# Patient Record
Sex: Male | Born: 1985 | Race: Black or African American | Hispanic: No | Marital: Single | Smoking: Current every day smoker
Health system: Southern US, Community
[De-identification: ages and names within clinical notes are randomized; demographics above are authoritative.]

## PROBLEM LIST (undated history)

## (undated) DIAGNOSIS — Z789 Other specified health status: Secondary | ICD-10-CM

## (undated) HISTORY — PX: OTHER SURGICAL HISTORY: SHX169

---
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
## 2004-07-15 ENCOUNTER — Emergency Department (HOSPITAL_COMMUNITY): Admission: EM | Admit: 2004-07-15 | Discharge: 2004-07-15 | Payer: Self-pay | Admitting: Emergency Medicine

## 2005-01-17 ENCOUNTER — Emergency Department (HOSPITAL_COMMUNITY): Admission: EM | Admit: 2005-01-17 | Discharge: 2005-01-17 | Payer: Self-pay | Admitting: Emergency Medicine

## 2005-06-05 ENCOUNTER — Emergency Department (HOSPITAL_COMMUNITY): Admission: EM | Admit: 2005-06-05 | Discharge: 2005-06-05 | Payer: Self-pay | Admitting: Emergency Medicine

## 2005-07-14 ENCOUNTER — Emergency Department (HOSPITAL_COMMUNITY): Admission: EM | Admit: 2005-07-14 | Discharge: 2005-07-15 | Payer: Self-pay | Admitting: Emergency Medicine

## 2006-03-24 ENCOUNTER — Emergency Department (HOSPITAL_COMMUNITY): Admission: EM | Admit: 2006-03-24 | Discharge: 2006-03-24 | Payer: Self-pay | Admitting: Family Medicine

## 2006-10-22 ENCOUNTER — Emergency Department (HOSPITAL_COMMUNITY): Admission: EM | Admit: 2006-10-22 | Discharge: 2006-10-23 | Payer: Self-pay | Admitting: Emergency Medicine

## 2010-07-25 NOTE — Discharge Summary (Signed)
[facility]  Patient:    Joseph Bernard, Joseph Bernard                   MRN: [id number] Adm. Date:  [id number] Disc. Date: [id number] Attending:  Cathren Laine                           Discharge Summary  REASON FOR ADMISSION:  This 25 year old black male was admitted status post drug overdose with over the counter pills.  HISTORY OF PRESENT ILLNESS:  The patient reports on the day prior to admission that he had gotten into an argument with his mother and took an overdose of pills.  He apparently did this in an attempt to get out of going to wilderness camp.  He is court ordered into wilderness camp after having been placed in a juvenile detention center eight times in the past and having multiple different adjudications for various offenses.  He was taken off antidepressant medication several weeks ago in preparation for his going to _________  camp, has tolerated this well.  He denies any current symptoms of major depression. He does admit to a depressed, irritable, and angry mood whenever he thinks about going to wilderness camp, but denies that this mood persists for most of the day every day.  He does admit to some early morning awakening.  He denies any change in appetite or weight.  He denies any anhedonia or giving up on activities previously pleasureable.  He denies any fatigue or loss of energy. He denies any psychomotor agitation or retardation.  PAST HISTORY:  Significant for his being an inpatient at [facility] in March of 2002 following a similar drug overdose attempt that was felt to be a suicidal gesture.  At that time he was attempting to avoid going to juvenile detention and took a drug overdose instead.  He does report that his current episode is similar to that and that he was trying to get out of going to wilderness camp, but now he recognizes that his options are either going to wilderness camp or going to a training school and he would  prefer to go to wilderness camp.  He has [facility] Betti Cruz.  He has a long-standing history of attention deficit hyperactivity disorder, conduct disorder, and polysubstance abuse versus dependence.  SUBSTANCE ABUSE HISTORY:  He has been using cannabis since the age of 21, approximately twice per month or more often if available.  His last use was approximately two weeks ago.  He has a long-standing history of alcohol abuse and had heavy use of alcohol up until March when he entered the [facility].  He smokes a half a pack of cigarettes per day and has done so for the past 1-1/2 years.  LEGAL HISTORY:  Legally he has been adjudicated for credit card theft, fighting, stealing computers and computer disks, as well as being adjudicated for probation violations.  He has been in juvenile detention at least eight times.  PAST MEDICAL HISTORY:  He has no past medical history of any problems.  ALLERGIES:  He has no known drug allergies or sensitivities.  MEDICATIONS:  He is on no current medications.  FAMILY/SOCIAL HISTORY:  He lives with his parents and one brother.  His grandmother has a history of polysubstance dependence.  The patient is currently in the 10th grade.  STRENGTHS AND ASSETS:  His parents are very supportive of him.  MENTAL STATUS EXAMINATION:  The patient presents as a well-developed, well-nourished adolescent black male, who is alert, oriented x 4, disheveled and unkept, and his appearance is compatible with his stated age.  His speech is coherent with a normal rate and volume of speech.  He displays no looseness of associations or evidence of a thought disorder.  His affect and mood are irritable, angry, and somewhat depressed, but they are quite labile and shift rapidly.  He appears to be depressed and irritable only when discussing his going to wilderness camp, but is quite happy when talking  about other things. He does not display a furrowed brow.  He does not display any psychomotor agitation or retardation.  His concentration and attention span are decreased. He displays poor impulse control and is easily distracted.  His immediate recall, short term memory, and remote memory are intact.  His thought processes are goal directed.  His similarities and differences are within normal limits and he is able to abstract to simple proverbs.  His strengths and assets are that his parents are very supportive of him.  ADMITTING AND DISCHARGE DIAGNOSES: Axis I:    1. 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            2. Rule out major depression, mild to moderate.            3. Attention deficit hyperactivity disorder, combined type.            4. Conduct disorder.            5. Polysubstance abuse.            6. Rule out polysubstance dependence. Axis II:   1. Antisocial traits.            2. Rule out personality disorder, not otherwise specified.            3. Rule out learning disorder, not otherwise specified. Axis III:  None. Axis IV:   Severe. Axis V:    Code 30.  FURTHER EVALUATION AND TREATMENT RECOMMENDATIONS: 1. The patient has been admitted for observation.  He does not appear to be    in need of an antidepressant medication at this time.  Psychotherapy this    morning has focused on improving his impulse control, decreasing potential    for self-harm and decreasing cognitive distortions as well as improving    his anger management skills.  He no longer appears to be a danger to    himself or others and agrees that wilderness camp is probably his best    option at this point in time.  He appears to be motivated to go there at    this point and consequently, I feel that he may be psychiatrically cleared    to enter wilderness camp.  He is consequently discharged to the custody of    his mother and father and probation officer,  who will transport him to    that facility. 2. He is discharged on an unrestricted level of activity and a regular diet.  MEDICATIONS:  He is discharged on no medications.   FOLLOW-UP:  He will follow up for all further mental health care at wilderness camp.  Consequently, I will sign off on the case at this time. DD:  08/18/00 TD:  08/18/00 Job: [id number]

## 2010-08-31 ENCOUNTER — Emergency Department (HOSPITAL_BASED_OUTPATIENT_CLINIC_OR_DEPARTMENT_OTHER)
Admission: EM | Admit: 2010-08-31 | Discharge: 2010-08-31 | Disposition: A | Discharge status: Other Acute Inpatient Hospital | Payer: Self-pay | Attending: Emergency Medicine | Admitting: Emergency Medicine

## 2010-08-31 ENCOUNTER — Emergency Department (INDEPENDENT_AMBULATORY_CARE_PROVIDER_SITE_OTHER): Payer: Self-pay

## 2010-08-31 ENCOUNTER — Inpatient Hospital Stay (HOSPITAL_COMMUNITY)
Admission: AD | Admit: 2010-08-31 | Discharge: 2010-09-01 | DRG: 918 | Disposition: A | Discharge status: Home / Self Care | Payer: Self-pay | Source: Ambulatory Visit | Attending: Internal Medicine | Admitting: Internal Medicine

## 2010-08-31 DIAGNOSIS — F909 Attention-deficit hyperactivity disorder, unspecified type: Secondary | ICD-10-CM | POA: Diagnosis present

## 2010-08-31 DIAGNOSIS — F988 Other specified behavioral and emotional disorders with onset usually occurring in childhood and adolescence: Secondary | ICD-10-CM | POA: Insufficient documentation

## 2010-08-31 DIAGNOSIS — T6591XA Toxic effect of unspecified substance, accidental (unintentional), initial encounter: Secondary | ICD-10-CM

## 2010-08-31 DIAGNOSIS — IMO0002 Reserved for concepts with insufficient information to code with codable children: Secondary | ICD-10-CM | POA: Diagnosis present

## 2010-08-31 DIAGNOSIS — T65891A Toxic effect of other specified substances, accidental (unintentional), initial encounter: Secondary | ICD-10-CM

## 2010-08-31 DIAGNOSIS — R4182 Altered mental status, unspecified: Secondary | ICD-10-CM | POA: Insufficient documentation

## 2010-08-31 DIAGNOSIS — F17200 Nicotine dependence, unspecified, uncomplicated: Secondary | ICD-10-CM | POA: Insufficient documentation

## 2010-08-31 DIAGNOSIS — D72829 Elevated white blood cell count, unspecified: Secondary | ICD-10-CM

## 2010-08-31 DIAGNOSIS — F29 Unspecified psychosis not due to a substance or known physiological condition: Secondary | ICD-10-CM | POA: Diagnosis present

## 2010-08-31 DIAGNOSIS — T551X1A Toxic effect of detergents, accidental (unintentional), initial encounter: Principal | ICD-10-CM | POA: Diagnosis present

## 2010-08-31 LAB — COMPREHENSIVE METABOLIC PANEL
ALT: 19 U/L (ref 0–53)
Alkaline Phosphatase: 52 U/L (ref 39–117)
CO2: 19 mEq/L (ref 19–32)
GFR calc Af Amer: >60 mL/min (ref >60)
GFR calc non Af Amer: >60 mL/min (ref >60)
Glucose, Bld: 134 mg/dL — ABNORMAL HIGH (ref 70–99)
Potassium: 3.8 mEq/L (ref 3.5–5.1)
Sodium: 140 mEq/L (ref 135–145)
Total Protein: 6.3 g/dL (ref 6.0–8.3)

## 2010-08-31 LAB — DIFFERENTIAL
Basophils Absolute: 0 10*3/uL (ref 0.0–0.1)
Lymphocytes Relative: 9 % — ABNORMAL LOW (ref 12–46)
Neutro Abs: 13.3 10*3/uL — ABNORMAL HIGH (ref 1.7–7.7)

## 2010-08-31 LAB — RAPID URINE DRUG SCREEN, HOSP PERFORMED: Benzodiazepines: POSITIVE — AB

## 2010-08-31 LAB — URINALYSIS, ROUTINE W REFLEX MICROSCOPIC
Glucose, UA: NEGATIVE mg/dL
Hgb urine dipstick: NEGATIVE
Protein, ur: 30 mg/dL — AB
pH: 6 (ref 5.0–8.0)

## 2010-08-31 LAB — URINE MICROSCOPIC-ADD ON

## 2010-08-31 LAB — MRSA PCR SCREENING: MRSA by PCR: NEGATIVE

## 2010-08-31 LAB — CK: Total CK: 651 U/L — ABNORMAL HIGH (ref 7–232)

## 2010-08-31 LAB — CBC
HCT: 44.1 % (ref 39.0–52.0)
Hemoglobin: 15.7 g/dL (ref 13.0–17.0)
RBC: 4.88 MIL/uL (ref 4.22–5.81)
WBC: 16.2 10*3/uL — ABNORMAL HIGH (ref 4.0–10.5)

## 2010-08-31 LAB — PHOSPHORUS: Phosphorus: 4.5 mg/dL (ref 2.3–4.6)

## 2010-08-31 LAB — LACTIC ACID, PLASMA: Lactic Acid, Venous: 0.4 mmol/L — ABNORMAL LOW (ref 0.5–2.2)

## 2010-08-31 LAB — MAGNESIUM: Magnesium: 2.7 mg/dL — ABNORMAL HIGH (ref 1.5–2.5)

## 2010-09-01 DIAGNOSIS — F4310 Post-traumatic stress disorder, unspecified: Secondary | ICD-10-CM

## 2010-09-01 LAB — CBC
MCH: 31.4 pg (ref 26.0–34.0)
MCHC: 33.1 g/dL (ref 30.0–36.0)
MCV: 95 fL (ref 78.0–100.0)
Platelets: 179 10*3/uL (ref 150–400)
RBC: 4.01 MIL/uL — ABNORMAL LOW (ref 4.22–5.81)
RDW: 13.3 % (ref 11.5–15.5)

## 2010-09-01 LAB — BASIC METABOLIC PANEL
Calcium: 8.5 mg/dL (ref 8.4–10.5)
Creatinine, Ser: 0.78 mg/dL (ref 0.50–1.35)
GFR calc Af Amer: >60 mL/min (ref >60)
GFR calc non Af Amer: >60 mL/min (ref >60)
Sodium: 140 mEq/L (ref 135–145)

## 2010-09-01 LAB — CK: Total CK: 547 U/L — ABNORMAL HIGH (ref 7–232)

## 2010-09-02 NOTE — Consult Note (Signed)
  NAME:  Joseph Bernard, KINNARD NO.:  [id number]  MEDICAL RECORD NO.:  [id number]  LOCATION:  1513                         FACILITY:  [facility]  PHYSICIAN:  Eulogio Ditch, MD DATE OF BIRTH:  09-26-1985  DATE OF CONSULTATION:  09/01/2010 DATE OF DISCHARGE:                                CONSULTATION   REASON FOR CONSULTATION:  Polysubstance abuse, overdose on bath salts.  HISTORY OF PRESENT ILLNESS:  [age]-year-old African American male, who has extensive history of abusing drugs.  He also has a history of ADHD, bipolar disorder and prior suicide attempts.  Patient reported that he do not have history of bipolar disorder, but he used drugs.  He is not taking cocaine or alcohol now, but he started using bath salt after remaining sober for 9 months.  Patient also has a history of 8 juvenile accounts earlier for assault, robberies, and probation violation.  In the ER, patient was very agitated and was medicated with Geodon.  Patient has a history of admission to behavioral health in 2002.  Patient reported that he works at and also in the [employer], lives with the mother.  MENTAL STATUS EXAMINATION:  Patient is very calm, cooperative during the interview.  Fair eye contact.  No abnormal movements noticed.  Speech: Normal in rate, rhythm and volume.  Mood:  Euthymic.  Affect:  Mood congruent.  Thought process:  Logical and goal directed.  Thought content:  Not suicidal or homicidal, not delusional.  Thought perception:  No audiovisual hallucination reported, not internally preoccupied.  Cognition:  Alert, awake, oriented x3.  Attention and concentration:  Fair.  Memory:  Intact.  Abstraction ability:  Good. Insight and judgment:  Intact.  DIAGNOSES:  AXIS I: 1. Polysubstance dependence. 2. Mood disorder, not otherwise specified. AXIS II:  Cluster B personality traits, rule out antisocial personality disorder/borderline personality disorder. AXIS III:  See medical  notes. AXIS IV:  Chronic substance abuse. AXIS V:  50.  RECOMMENDATIONS: 1. At this time, patient is not psychotic, manic or suicidal that he     need admission in the inpatient psychiatry. 2. Patient reported that he will continuously use bath salts and at     certain points when he thinks he need counseling or to be on any     medication, he will go to the mental health by himself, but at this     time he is not interested in getting any kind of rehab or any     treatment. 3. Psychoeducation given to the patient regarding abuse of the drugs. 4. At this time, patient does not meet criteria to be admitted in the     inpatient on IVC. 5. I told the social worker to call the mother and educate on her as     she was very concerned and wants him to be admitted in the     hospital, but patient does not meet criteria to be admitted on IVC.     Eulogio Ditch, MD     SA/MEDQ  D:  09/01/2010  T:  09/01/2010  Job:  [id number]  Electronically Signed by Eulogio Ditch  on 09/02/2010 12:09:01 PM

## 2010-09-05 ENCOUNTER — Emergency Department (HOSPITAL_COMMUNITY)
Admission: EM | Admit: 2010-09-05 | Discharge: 2010-09-06 | Disposition: A | Discharge status: Home / Self Care | Payer: Self-pay | Attending: Emergency Medicine | Admitting: Emergency Medicine

## 2010-09-05 DIAGNOSIS — F1410 Cocaine abuse, uncomplicated: Secondary | ICD-10-CM | POA: Insufficient documentation

## 2010-09-05 DIAGNOSIS — F1910 Other psychoactive substance abuse, uncomplicated: Secondary | ICD-10-CM | POA: Insufficient documentation

## 2010-09-05 DIAGNOSIS — F17200 Nicotine dependence, unspecified, uncomplicated: Secondary | ICD-10-CM | POA: Insufficient documentation

## 2010-09-05 DIAGNOSIS — F1010 Alcohol abuse, uncomplicated: Secondary | ICD-10-CM | POA: Insufficient documentation

## 2010-09-05 DIAGNOSIS — R4182 Altered mental status, unspecified: Secondary | ICD-10-CM | POA: Insufficient documentation

## 2010-09-05 DIAGNOSIS — F411 Generalized anxiety disorder: Secondary | ICD-10-CM | POA: Insufficient documentation

## 2010-09-05 DIAGNOSIS — F1210 Cannabis abuse, uncomplicated: Secondary | ICD-10-CM | POA: Insufficient documentation

## 2010-09-05 DIAGNOSIS — F988 Other specified behavioral and emotional disorders with onset usually occurring in childhood and adolescence: Secondary | ICD-10-CM | POA: Insufficient documentation

## 2010-09-05 LAB — URINALYSIS, ROUTINE W REFLEX MICROSCOPIC
Bilirubin Urine: NEGATIVE
Ketones, ur: NEGATIVE mg/dL
Leukocytes, UA: NEGATIVE
Nitrite: NEGATIVE
Protein, ur: NEGATIVE mg/dL

## 2010-09-05 LAB — DIFFERENTIAL
Basophils Relative: 1 % (ref 0–1)
Eosinophils Absolute: 0.3 10*3/uL (ref 0.0–0.7)
Lymphs Abs: 1.7 10*3/uL (ref 0.7–4.0)
Monocytes Relative: 14 % — ABNORMAL HIGH (ref 3–12)
Neutro Abs: 4.9 10*3/uL (ref 1.7–7.7)
Neutrophils Relative %: 60 % (ref 43–77)

## 2010-09-05 LAB — CBC
Hemoglobin: 14.8 g/dL (ref 13.0–17.0)
MCH: 32 pg (ref 26.0–34.0)
Platelets: 226 10*3/uL (ref 150–400)
RBC: 4.62 MIL/uL (ref 4.22–5.81)
WBC: 8.1 10*3/uL (ref 4.0–10.5)

## 2010-09-05 LAB — ETHANOL: Alcohol, Ethyl (B): 93 mg/dL — ABNORMAL HIGH (ref 0–11)

## 2010-09-05 LAB — BASIC METABOLIC PANEL
BUN: 6 mg/dL (ref 6–23)
CO2: 22 mEq/L (ref 19–32)
Chloride: 102 mEq/L (ref 96–112)
GFR calc non Af Amer: >60 mL/min (ref >60)
Glucose, Bld: 105 mg/dL — ABNORMAL HIGH (ref 70–99)
Potassium: 3.3 mEq/L — ABNORMAL LOW (ref 3.5–5.1)
Sodium: 137 mEq/L (ref 135–145)

## 2010-09-05 LAB — RAPID URINE DRUG SCREEN, HOSP PERFORMED
Barbiturates: NOT DETECTED
Benzodiazepines: POSITIVE — AB

## 2010-09-06 ENCOUNTER — Emergency Department (HOSPITAL_COMMUNITY)
Admission: EM | Admit: 2010-09-06 | Discharge: 2010-09-07 | Disposition: A | Discharge status: Other Acute Inpatient Hospital | Payer: Self-pay | Attending: Emergency Medicine | Admitting: Emergency Medicine

## 2010-09-06 DIAGNOSIS — X58XXXA Exposure to other specified factors, initial encounter: Secondary | ICD-10-CM | POA: Insufficient documentation

## 2010-09-06 DIAGNOSIS — S51809A Unspecified open wound of unspecified forearm, initial encounter: Secondary | ICD-10-CM | POA: Insufficient documentation

## 2010-09-06 DIAGNOSIS — R45851 Suicidal ideations: Secondary | ICD-10-CM | POA: Insufficient documentation

## 2010-09-06 DIAGNOSIS — F988 Other specified behavioral and emotional disorders with onset usually occurring in childhood and adolescence: Secondary | ICD-10-CM | POA: Insufficient documentation

## 2010-09-06 DIAGNOSIS — F341 Dysthymic disorder: Secondary | ICD-10-CM | POA: Insufficient documentation

## 2010-09-06 LAB — CBC
Hemoglobin: 14.6 g/dL (ref 13.0–17.0)
MCV: 93 fL (ref 78.0–100.0)
Platelets: 219 10*3/uL (ref 150–400)
RBC: 4.43 MIL/uL (ref 4.22–5.81)
WBC: 8.8 10*3/uL (ref 4.0–10.5)

## 2010-09-06 LAB — RAPID URINE DRUG SCREEN, HOSP PERFORMED
Amphetamines: NOT DETECTED
Benzodiazepines: NOT DETECTED
Tetrahydrocannabinol: NOT DETECTED

## 2010-09-06 LAB — COMPREHENSIVE METABOLIC PANEL
AST: 28 U/L (ref 0–37)
CO2: 23 mEq/L (ref 19–32)
Calcium: 9.3 mg/dL (ref 8.4–10.5)
Creatinine, Ser: 0.82 mg/dL (ref 0.50–1.35)
GFR calc Af Amer: >60 mL/min (ref >60)
GFR calc non Af Amer: >60 mL/min (ref >60)
Glucose, Bld: 81 mg/dL (ref 70–99)

## 2010-09-06 LAB — ACETAMINOPHEN LEVEL: Acetaminophen (Tylenol), Serum: <15 ug/mL (ref 10–30)

## 2010-09-06 LAB — ETHANOL: Alcohol, Ethyl (B): 234 mg/dL — ABNORMAL HIGH (ref 0–11)

## 2010-09-06 LAB — DIFFERENTIAL
Basophils Absolute: 0.1 10*3/uL (ref 0.0–0.1)
Basophils Relative: 1 % (ref 0–1)
Eosinophils Relative: 3 % (ref 0–5)
Lymphocytes Relative: 25 % (ref 12–46)
Monocytes Relative: 13 % — ABNORMAL HIGH (ref 3–12)
Neutro Abs: 5.1 10*3/uL (ref 1.7–7.7)

## 2010-09-12 NOTE — Discharge Summary (Signed)
  NAME:  Joseph Bernard, HUARACHA NO.:  [id number]  MEDICAL RECORD NO.:  [id number]  LOCATION:  1513                         FACILITY:  [facility]  PHYSICIAN:  Peggye Pitt, M.D. DATE OF BIRTH:  28-Jul-1985  DATE OF ADMISSION:  08/31/2010 DATE OF DISCHARGE:  09/01/2010                              DISCHARGE SUMMARY   PRIMARY CARE PHYSICIAN:  He is currently unassigned, but we will set him up with [facility] as his new PCP.  DISCHARGE DIAGNOSES: 1. Acute psychosis/agitation secondary to bath salt use. 2. History of attention deficit hyperactivity disorder. 3. Prior history of suicidal attempts.  DISCHARGE MEDICATIONS:  Include: 1. Xanax 1 mg three times a day. 2. Adderall 30 mg daily.  CONSULTATION ON THIS HOSPITALIZATION:  Eulogio Ditch, M.D. with psychiatry.  IMAGES AND PROCEDURES:  Chest x-ray on August 31, 2010 that showed no acute abnormalities.  HISTORY AND PHYSICAL:  For full details, please refer to dictation on August 31, 2010, but in brief, Josey is a 25 year old African American young gentleman, who presented to the hospital with psychosis and aggressiveness and reported ingestion of bath salts.  By the time he was seen by myself, he was already in the hospital and sedated and hence no history was able to be obtained.  HOSPITAL COURSE BY PROBLEM: 1. Acute psychosis and agitation:  This is secondary to the bath salt     use.  He today is awake and has been evaluated by Psychiatry, who     believes that he is competent to make his own decisions and hence     does not require involuntary commitment.  Patient has decided to     discharge home today.  Social work has seen him; however, he is not     interested in any help that she may be able to offer. 2. Attention deficit hyperactivity disorder:  I have given him a 1-     month prescription of Adderall.  Subsequent prescriptions will need     to be filled by PCP.  VITAL SIGNS ON DAY OF DISCHARGE:   Blood pressure 132/71, heart rate 78, respirations 20, sats 97% on room air and temperature 97.4.     Peggye Pitt, M.D.     EH/MEDQ  D:  09/01/2010  T:  09/01/2010  Job:  [id number]  cc:   Eulogio Ditch, MD  Electronically Signed by Peggye Pitt M.D. on 09/12/2010 08:14:47 AM

## 2010-09-12 NOTE — H&P (Signed)
NAME:  Joseph Bernard, Joseph Bernard NO.:  [id number]  MEDICAL RECORD NO.:  [id number]  LOCATION:  1228                         FACILITY:  [facility]  PHYSICIAN:  Peggye Pitt, M.D. DATE OF BIRTH:  May 02, 1985  DATE OF ADMISSION:  08/31/2010 DATE OF DISCHARGE:                             HISTORY & PHYSICAL   PRIMARY CARE PHYSICIAN:  The patient's primary care physician is unknown at this time.  CHIEF COMPLAINT:  Aggressiveness, agitation and "overdose on bath salts."  HISTORY AND PHYSICAL:  Joseph Bernard is a 25 year old African-American young gentleman who reportedly has a history of prior suicide attempts, attention deficit hyperactivity disorder and anxiety, who was brought to [facility] Emergency Department via EMS.  It is unclear to me at this time how EMS picked him up.  Per ED physician report, it appears Joseph Bernard on arrival was extremely agitated, hallucinating with very high heart rates into the 180s and required very strong sedation with 20 mg of Geodon and 20 mg of Versed as well as 6 mg of Ativan.  At this moment when I am seeing Joseph Bernard, he is completely sedated and is unarousable.  For this reason, no further history is obtainable at this time.  ALLERGIES:  Unknown.  PAST MEDICAL HISTORY:  Unknown.  MEDICATIONS:  Unknown.  SOCIAL HISTORY:  Unknown.  REVIEW OF SYSTEMS:  Unable to perform.  FAMILY HISTORY:  Unknown.  PHYSICAL EXAMINATION:  VITAL SIGNS:  Vitals on admission, blood pressure 110/67, heart rate 139, respirations 26, sats 99% on room air and temperature of 97.7. GENERAL:  In general, he is currently sedated. NECK:  His neck is supple.  No JVD, no lymphadenopathy, no bruits, no goiter. HEART:  His heart is currently regular rate and rhythm without murmurs, rubs or gallops. LUNGS:  His lungs appear clear to auscultation bilaterally anteriorly. ABDOMEN:  Soft, nontender, nondistended.  Positive bowel sounds. EXTREMITIES:  No  edema.  Positive pulses. NEUROLOGIC EXAM:  Unable to perform.  LABORATORY DATA:  Labs on admission; sodium 140, potassium 3.8, chloride 101, bicarb 19, BUN 17, creatinine 1.20, glucose of 134.  All of his LFTs are within normal limits.  WBC is 16.2, hemoglobin 15.7, platelets of 216.  Tylenol level of less than 15 and alcohol level of less than 11, aspirin level less than 2, lactic acid level that is normal at 0.4. A urine drug screen that is only positive for benzodiazepines that he had already received in the ED.  Urinalysis is essentially negative.  Images performed include a chest x-ray that showed no acute abnormalities.  ASSESSMENT AND PLAN:  Bad salts toxic ingestion.  Unclear at this point if this was on overdose or suicide attempt, this is unlikely.  He is currently sedated, although it is noted that he was very aggressive and required four-point leather restraints while in the emergency department.  For now, we will simply watch him.  I will order a 12-lead EKG to make sure he does not have prolonged QT just in case we need to give him any more antipsychotics or benzodiazepines.  We will start him with aggressive hydration with normal saline.  A CPK level will be ordered.  Rest of  lab values at this moment appear intact without acute abnormalities.  Poison control has been called and updated on the patient's situation.  A psychiatric consult has been obtained as well.     Peggye Pitt, M.D.     EH/MEDQ  D:  08/31/2010  T:  08/31/2010  Job:  [id number]  Electronically Signed by Peggye Pitt M.D. on 09/12/2010 08:15:04 AM

## 2011-03-12 ENCOUNTER — Encounter: Payer: Self-pay | Admitting: Adult Health

## 2011-03-12 ENCOUNTER — Emergency Department (HOSPITAL_COMMUNITY)
Admission: EM | Admit: 2011-03-12 | Discharge: 2011-03-14 | Payer: Self-pay | Attending: Emergency Medicine | Admitting: Emergency Medicine

## 2011-03-12 DIAGNOSIS — F329 Major depressive disorder, single episode, unspecified: Secondary | ICD-10-CM

## 2011-03-12 DIAGNOSIS — R45851 Suicidal ideations: Secondary | ICD-10-CM | POA: Insufficient documentation

## 2011-03-12 DIAGNOSIS — R079 Chest pain, unspecified: Secondary | ICD-10-CM | POA: Insufficient documentation

## 2011-03-12 DIAGNOSIS — R059 Cough, unspecified: Secondary | ICD-10-CM | POA: Insufficient documentation

## 2011-03-12 DIAGNOSIS — R05 Cough: Secondary | ICD-10-CM | POA: Insufficient documentation

## 2011-03-12 DIAGNOSIS — F1410 Cocaine abuse, uncomplicated: Secondary | ICD-10-CM | POA: Insufficient documentation

## 2011-03-12 DIAGNOSIS — F3289 Other specified depressive episodes: Secondary | ICD-10-CM | POA: Insufficient documentation

## 2011-03-12 DIAGNOSIS — F17200 Nicotine dependence, unspecified, uncomplicated: Secondary | ICD-10-CM | POA: Insufficient documentation

## 2011-03-12 LAB — RAPID URINE DRUG SCREEN, HOSP PERFORMED
Amphetamines: NOT DETECTED
Barbiturates: NOT DETECTED
Benzodiazepines: NOT DETECTED

## 2011-03-12 LAB — COMPREHENSIVE METABOLIC PANEL
AST: 24 U/L (ref 0–37)
Albumin: 3.9 g/dL (ref 3.5–5.2)
Alkaline Phosphatase: 84 U/L (ref 39–117)
Chloride: 103 mEq/L (ref 96–112)
Potassium: 3.9 mEq/L (ref 3.5–5.1)
Sodium: 137 mEq/L (ref 135–145)
Total Bilirubin: 0.3 mg/dL (ref 0.3–1.2)

## 2011-03-12 LAB — CBC
Platelets: 224 10*3/uL (ref 150–400)
RDW: 13.3 % (ref 11.5–15.5)
WBC: 11.3 10*3/uL — ABNORMAL HIGH (ref 4.0–10.5)

## 2011-03-12 LAB — ACETAMINOPHEN LEVEL: Acetaminophen (Tylenol), Serum: <15 ug/mL (ref 10–30)

## 2011-03-12 MED ORDER — ONDANSETRON HCL 4 MG PO TABS
4.0000 mg | ORAL_TABLET | Freq: Three times a day (TID) | ORAL | Status: DC | PRN
  Filled 2011-03-12: qty 1

## 2011-03-12 MED ORDER — ZOLPIDEM TARTRATE 5 MG PO TABS
5.0000 mg | ORAL_TABLET | Freq: Every evening | ORAL | Status: DC | PRN
  Administered 2011-03-13: 5 mg via ORAL
  Filled 2011-03-12: qty 1

## 2011-03-12 MED ORDER — NICOTINE 21 MG/24HR TD PT24
21.0000 mg | MEDICATED_PATCH | Freq: Every day | TRANSDERMAL | Status: DC
  Administered 2011-03-13 – 2011-03-14 (×2): 21 mg via TRANSDERMAL
  Filled 2011-03-12 (×2): qty 1

## 2011-03-12 MED ORDER — QUETIAPINE FUMARATE 100 MG PO TABS
200.0000 mg | ORAL_TABLET | Freq: Every day | ORAL | Status: DC
  Administered 2011-03-12 – 2011-03-13 (×2): 200 mg via ORAL
  Filled 2011-03-12: qty 2

## 2011-03-12 MED ORDER — LORAZEPAM 1 MG PO TABS
2.0000 mg | ORAL_TABLET | Freq: Once | ORAL | Status: AC
  Administered 2011-03-12: 2 mg via ORAL
  Filled 2011-03-12: qty 2

## 2011-03-12 MED ORDER — ALUM & MAG HYDROXIDE-SIMETH 200-200-20 MG/5ML PO SUSP: 30.0000 mL | ORAL | Status: DC | PRN

## 2011-03-12 MED ORDER — ACETAMINOPHEN 325 MG PO TABS: 650.0000 mg | ORAL_TABLET | ORAL | Status: DC | PRN

## 2011-03-12 MED ORDER — IBUPROFEN 600 MG PO TABS: 600.0000 mg | ORAL_TABLET | Freq: Three times a day (TID) | ORAL | Status: DC | PRN

## 2011-03-12 MED ORDER — ATOMOXETINE HCL 25 MG PO CAPS
75.0000 mg | ORAL_CAPSULE | Freq: Every day | ORAL | Status: DC
  Administered 2011-03-13: 75 mg via ORAL
  Filled 2011-03-12 (×4): qty 3

## 2011-03-12 MED ORDER — QUETIAPINE FUMARATE 100 MG PO TABS
200.0000 mg | ORAL_TABLET | Freq: Every day | ORAL | Status: DC
  Filled 2011-03-12: qty 2

## 2011-03-12 MED ORDER — LORAZEPAM 1 MG PO TABS
2.0000 mg | ORAL_TABLET | Freq: Three times a day (TID) | ORAL | Status: DC | PRN
  Administered 2011-03-13 – 2011-03-14 (×3): 2 mg via ORAL
  Filled 2011-03-12 (×3): qty 2

## 2011-03-12 NOTE — ED Provider Notes (Signed)
History     CSN: [id number]  Arrival date & time 03/12/11  1950   First MD Initiated Contact with Patient 03/12/11 2116      Chief Complaint  Patient presents with  . Medical Clearance   26 year old male states he is a cocaine addict and with a history of bipolar disorder and wishes help with detox. He also states that he has not used cocaine in over a week. However, he does express that he is extremely depressed and has had suicidal ideation. His father taking pills and cutting himself. He has had associated symptoms of early morning awakening, crying spells, and anhedonia. He denies hallucinations. He has been getting outpatient treatment for his depression at [facility], and he was sent here because they felt that they were not making adequate progress. He denies other drug use, but he does drink heavily drinking approximately a fifth of liquor a day and he does smoke cigarettes.  (Consider location/radiation/quality/duration/timing/severity/associated sxs/prior treatment) The history is provided by the patient.    History reviewed. No pertinent past medical history.  History reviewed. No pertinent past surgical history.  History reviewed. No pertinent family history.  History  Substance Use Topics  . Smoking status: Current Everyday Smoker  . Smokeless tobacco: Not on file  . Alcohol Use: Yes      Review of Systems  All other systems reviewed and are negative.    Allergies  Review of patient's allergies indicates no known allergies.  Home Medications   Current Outpatient Rx  Name Route Sig Dispense Refill  . ATOMOXETINE HCL 25 MG PO CAPS Oral Take 75 mg by mouth daily.      . QUETIAPINE FUMARATE 100 MG PO TABS Oral Take 150 mg by mouth daily.        BP 138/93  Pulse 76  Temp 98.1 F (36.7 C)  Resp 20  SpO2 100%  Physical Exam  Nursing note and vitals reviewed.  26 year old male who appears overtly depressed and is intermittently crying during the examination.  Vital signs are normal. Oxygen saturation is 100% which is normal. Head is normocephalic and atraumatic. PERRLA, EOMI. Oropharynx is clear. Neck is supple without adenopathy or JVD. Back is nontender. Lungs are clear without rales, wheezes, rhonchi. Heart has regular rate and rhythm without murmur. The chest wall tenderness. Abdomen is soft, flat, nontender without masses or hepatosplenomegaly. Extremities have full range of motion, no cyanosis or edema. Skin is warm and moist without rash. Neurologic: Mental status is significant for depression. He is oriented x3. Cranial nerves are intact, there no focal motor or sensory deficits. Psychiatric: He does appear overtly depressed. He does not make good eye contact and tends to look at the 130s intermittently crying during the exam.  ED Course  Procedures (including critical care time)   Labs Reviewed  CBC  COMPREHENSIVE METABOLIC PANEL  ETHANOL  ACETAMINOPHEN LEVEL  URINE RAPID DRUG SCREEN (HOSP PERFORMED)   No results found. Results for orders placed during the hospital encounter of 03/12/11  CBC      Component Value Range   WBC 11.3 (*) 4.0 - 10.5 (K/uL)   RBC 4.74  4.22 - 5.81 (MIL/uL)   Hemoglobin 14.8  13.0 - 17.0 (g/dL)   HCT 40.9  81.1 - 91.4 (%)   MCV 92.6  78.0 - 100.0 (fL)   MCH 31.2  26.0 - 34.0 (pg)   MCHC 33.7  30.0 - 36.0 (g/dL)   RDW 78.2  95.6 - 21.3 (%)  Platelets 224  150 - 400 (K/uL)  COMPREHENSIVE METABOLIC PANEL      Component Value Range   Sodium 137  135 - 145 (mEq/L)   Potassium 3.9  3.5 - 5.1 (mEq/L)   Chloride 103  96 - 112 (mEq/L)   CO2 27  19 - 32 (mEq/L)   Glucose, Bld 92  70 - 99 (mg/dL)   BUN 10  6 - 23 (mg/dL)   Creatinine, Ser 1.61  0.50 - 1.35 (mg/dL)   Calcium 9.3  8.4 - 09.6 (mg/dL)   Total Protein 6.9  6.0 - 8.3 (g/dL)   Albumin 3.9  3.5 - 5.2 (g/dL)   AST 24  0 - 37 (U/L)   ALT 29  0 - 53 (U/L)   Alkaline Phosphatase 84  39 - 117 (U/L)   Total Bilirubin 0.3  0.3 - 1.2 (mg/dL)   GFR calc  non Af Amer >90  >90 (mL/min)   GFR calc Af Amer >90  >90 (mL/min)  ETHANOL      Component Value Range   Alcohol, Ethyl (B) <11  0 - 11 (mg/dL)  ACETAMINOPHEN LEVEL      Component Value Range   Acetaminophen (Tylenol), Serum <15.0  10 - 30 (ug/mL)  URINE RAPID DRUG SCREEN (HOSP PERFORMED)      Component Value Range   Opiates NONE DETECTED  NONE DETECTED    Cocaine POSITIVE (*) NONE DETECTED    Benzodiazepines NONE DETECTED  NONE DETECTED    Amphetamines NONE DETECTED  NONE DETECTED    Tetrahydrocannabinol NONE DETECTED  NONE DETECTED    Barbiturates NONE DETECTED  NONE DETECTED    No results found.    No diagnosis found.    MDM  Maj. depression with associated cocaine use. He'll be admitted to the psychiatric all been unit for evaluation by ACT Team.        Dione Booze, MD 03/13/11 [phone]

## 2011-03-12 NOTE — ED Notes (Signed)
Meds ordered at 2145 given on arrival to Psych ED

## 2011-03-12 NOTE — ED Notes (Signed)
Reports abuse of crack and cocaine for 4 years, abuse of ETOH. Pt staes, "If I do not get detox right now, I am at the point where I am going to try to kill myself. I have tried before" pt will not make eye contact.

## 2011-03-13 ENCOUNTER — Encounter (HOSPITAL_COMMUNITY): Payer: Self-pay

## 2011-03-13 NOTE — ED Notes (Signed)
Pt. In shower. 

## 2011-03-13 NOTE — ED Notes (Signed)
Patient up at window asking for supplies for shower. Provided toiletries for patient and clean scrubs. Cleaned room while patient showering.

## 2011-03-13 NOTE — ED Notes (Signed)
Pt. States that he was anxious, medication given.

## 2011-03-13 NOTE — ED Notes (Signed)
TC from [place] @ [facility]. Requesting demographic info on pt for him to complete review of chart for possible IPT admission. Warden/ranger & faxed to him.

## 2011-03-13 NOTE — ED Notes (Signed)
Pt. Ambulated to bathroom without any difficulty. 

## 2011-03-13 NOTE — ED Notes (Signed)
Pt. Ate 100% of bkft. 

## 2011-03-13 NOTE — BH Assessment (Signed)
Assessment Note   Joseph Bernard is a 26 y.o. male who presents to the ED with SI with plan to overdoes. Patient reports symptoms of depression including, anhedonia, crying spells, and feelings of hopelessness. Patient reports he has a history of bipolar disorder and has attempted suicide four times, by cutting his wrist and attempting overdoes. Patient has had prior inpatient treatment at [facility], [facility], [place], and [place]. Patient reports he does not currently see a psychiatrist or therapist. Patient also expresses symptoms of anxiety, stating he has panic attacks two to three times weekly. He reports racing thoughts and difficulty breathing. Patient reports seeing Rayetta Pigg practioner at [facility], but is unsure what her credentials are. Patient states he use to see an outpatient therapist until one month ago when treatment was terminated, patient is unsure cause of termination. Patient denies any HI, [facility], and history of abuse. Patient reports no recent changes in appetite or sleep patterns. Patient did not identify a recent stressful event.    Patient endorses substance abuse, stating he has been using crack cocaine off and on for the past 5 years. He states he uses an unknown amount 2 times daily. Patient reports last cocaine use four days ago. Patient is voluntarily seeking treatment to assist in reducing depressive and anxiety symptoms as well as assist with substance abuse.         Axis I: Bipolar, Depressed and Substance Abuse Axis II: Deferred Axis III: History reviewed. No pertinent past medical history. Axis IV: other psychosocial or environmental problems Axis V: 21-30 behavior considerably influenced by delusions or hallucinations OR serious impairment in judgment, communication OR inability to function in almost all areas  Past Medical History: History reviewed. No pertinent past medical history.  History reviewed. No pertinent past surgical history.  Family History: History  reviewed. No pertinent family history.  Social History:  reports that he has been smoking.  He does not have any smokeless tobacco history on file. He reports that he drinks alcohol. He reports that he uses illicit drugs (Cocaine).  Additional Social History:  Alcohol / Drug Use History of alcohol / drug use?: Yes Substance #1 Name of Substance 1: Cocaine 1 - Age of First Use: 20 1 - Frequency: reports 2x daily 1 - Duration: 5 years 1 - Last Use / Amount: reports 03/10/11/ unsure ammount   Allergies: No Known Allergies  Home Medications:  Medications Prior to Admission  Medication Dose Route Frequency Provider Last Rate Last Dose  . acetaminophen (TYLENOL) tablet 650 mg  650 mg Oral Q4H PRN Dione Booze, MD      . alum & mag hydroxide-simeth (MAALOX/MYLANTA) 200-200-20 MG/5ML suspension 30 mL  30 mL Oral PRN Dione Booze, MD      . atomoxetine (STRATTERA) capsule 75 mg  75 mg Oral Daily Dione Booze, MD      . ibuprofen (ADVIL,MOTRIN) tablet 600 mg  600 mg Oral Q8H PRN Dione Booze, MD      . LORazepam (ATIVAN) tablet 2 mg  2 mg Oral Once Dione Booze, MD   2 mg at 03/12/11 2240  . LORazepam (ATIVAN) tablet 2 mg  2 mg Oral Q8H PRN Dione Booze, MD      . nicotine (NICODERM CQ - dosed in mg/24 hours) patch 21 mg  21 mg Transdermal Daily Dione Booze, MD      . ondansetron [facility]) tablet 4 mg  4 mg Oral Q8H PRN Dione Booze, MD      . QUEtiapine (  SEROQUEL) tablet 200 mg  200 mg Oral QHS Dione Booze, MD   200 mg at 03/12/11 2241  . QUEtiapine (SEROQUEL) tablet 200 mg  200 mg Oral QHS Dione Booze, MD      . zolpidem [facility]) tablet 5 mg  5 mg Oral QHS PRN Dione Booze, MD       No current outpatient prescriptions on file as of 03/12/2011.    OB/GYN Status:  No LMP for male patient.  General Assessment Data Location of Assessment: [facility] ACT Assessment: Yes Living Arrangements: Alone Can pt return to current living arrangement?: Yes Admission Status: Voluntary Is patient capable of signing  voluntary admission?: Yes Transfer from: Home Referral Source: Self/Family/Friend     Risk to self Suicidal Ideation: Yes-Currently Present Suicidal Intent: Yes-Currently Present Is patient at risk for suicide?: Yes Suicidal Plan?: Yes-Currently Present Specify Current Suicidal Plan: overdoes Access to Means: Yes What has been your use of drugs/alcohol within the last 12 months?: cocaine Previous Attempts/Gestures: Yes How many times?: 4  Other Self Harm Risks: none Triggers for Past Attempts: Unpredictable Intentional Self Injurious Behavior: None Family Suicide History: No Recent stressful life event(s): Conflict (Comment);Other (Comment) (conflict with family, stopped cocaine use) Persecutory voices/beliefs?: No Depression: Yes Depression Symptoms: Despondent;Loss of interest in usual pleasures;Feeling worthless/self pity Substance abuse history and/or treatment for substance abuse?: No Suicide prevention information given to non-admitted patients: Not applicable  Risk to Others Homicidal Ideation: No Thoughts of Harm to Others: No Current Homicidal Intent: No Current Homicidal Plan: No Access to Homicidal Means: No Identified Victim: none History of harm to others?: No Assessment of Violence: None Noted Violent Behavior Description: none Does patient have access to weapons?: No Criminal Charges Pending?: No Does patient have a court date: No  Psychosis Hallucinations: None noted Delusions: None noted  Mental Status Report Appear/Hygiene: Disheveled Eye Contact: Fair Motor Activity: Unremarkable Speech: Soft;Logical/coherent Level of Consciousness: Drowsy Mood: Depressed;Anxious Affect: Depressed;Anxious Anxiety Level: Panic Attacks Panic attack frequency: 2 to 3 times weekly Most recent panic attack: 2 days ago Thought Processes: Coherent;Relevant Judgement: Unimpaired Orientation: Person;Place;Time;Situation Obsessive Compulsive Thoughts/Behaviors:  None  Cognitive Functioning Concentration: Normal Memory: Recent Intact;Remote Intact IQ: Average Insight: Fair Impulse Control: Poor Appetite: Good Weight Loss: 0  Weight Gain: 0  Sleep: No Change Vegetative Symptoms: None  Prior Inpatient Therapy Prior Inpatient Therapy: Yes Prior Therapy Dates: summer 2012, unsure of other dates Prior Therapy Facilty/Provider(s): [place], [facility], [facility], [facility] Reason for Treatment: Suicide attempts  Prior Outpatient Therapy Prior Outpatient Therapy: Yes Reason for Treatment: depression, anxiety, substace abuse  ADL Screening (condition at time of admission) Patient's cognitive ability adequate to safely complete daily activities?: Yes Patient able to express need for assistance with ADLs?: Yes Independently performs ADLs?: Yes Weakness of Legs: None Weakness of Arms/Hands: None  Home Assistive Devices/Equipment Home Assistive Devices/Equipment: None    Abuse/Neglect Assessment (Assessment to be complete while patient is alone) Physical Abuse: Denies Verbal Abuse: Denies Sexual Abuse: Denies Exploitation of patient/patient's resources: Denies Self-Neglect: Denies Values / Beliefs Cultural Requests During Hospitalization: None Spiritual Requests During Hospitalization: None     Nutrition Screen Unintentional weight loss greater than 10lbs within the last month: No  Additional Information 1:1 In Past 12 Months?: No CIRT Risk: No Elopement Risk: No Does patient have medical clearance?: Yes     Disposition:  Disposition Disposition of Patient: Inpatient treatment program;Referred to [facility] [place]) Type of inpatient treatment program: Adult Patient referred for inpatient treatment for further  evaluation and stablezation On Site Evaluation by:   Reviewed with Physician:     Georgina Quint A 03/13/2011 3:03 AM

## 2011-03-13 NOTE — ED Notes (Signed)
Patient's grandmother came to visit while this tech was taking vitals. Patient stated he wished she would have brought him a milkshake. Explained policy about no outside food allowed. Offered to make pt a milkshake. Patient agreed.

## 2011-03-13 NOTE — ED Provider Notes (Signed)
  Physical Exam  BP 119/68  Pulse 70  Temp(Src) 98.5 F (36.9 C) (Oral)  Resp 18  SpO2 100%  Physical Exam  ED Course  Procedures  MDM Sleeping comfortably, no distress      Glynn Octave, MD 03/13/11 [phone]

## 2011-03-14 ENCOUNTER — Inpatient Hospital Stay (HOSPITAL_COMMUNITY): Admission: RE | Admit: 2011-03-14 | Payer: Self-pay | Source: Ambulatory Visit | Admitting: Psychiatry

## 2011-03-14 ENCOUNTER — Emergency Department (HOSPITAL_COMMUNITY): Payer: Self-pay

## 2011-03-14 NOTE — ED Notes (Signed)
Dr zammit into see 

## 2011-03-14 NOTE — ED Provider Notes (Addendum)
Pt stable awaiting placement.  Pt had some right sided chest pain.  pe tender right side.  Chest x-ray bronchitic changes  Benny Lennert, MD 03/14/11 [id number]  Benny Lennert, MD 03/14/11 [phone]

## 2011-03-14 NOTE — ED Notes (Signed)
Up to the bathroom 

## 2011-03-14 NOTE — ED Notes (Signed)
Pharmacy will sent strattera when it is available

## 2011-03-14 NOTE — ED Notes (Signed)
Dr Patria Mane into see.  Pt handcuffed w/ [employer] but had taken his rt knee and struck his self in the nose-slightt nose bleed noted.

## 2011-03-14 NOTE — ED Provider Notes (Signed)
4:58 PM It is suspected that the pt called in a bomb threat. He will be taken to jail. Medically stable at this time  Lyanne Co, MD 03/14/11 1659

## 2011-03-14 NOTE — Progress Notes (Signed)
[facility] requested additional information on pt SA history.  ACT assessment did not indicate pt used alcohol and Dr. Preston Fleeting initial evaluation indicated pt did use alcohol.  Pt reports he consumes 5th of liquor daily prior to coming to the ED.  Pt last drink was the day he arrived to the ER on 03-11-10.  Pt reports diarahea, cramps, tingling in hands and feelings of nausea.  ACT assessor did not see any indications of withdraws with pt while speaking with him.  Pt appearance was appropriate, well groomed, appropriate in presentation, posture erect and pt verbalized clear plan to get help for SI and SA issues and then assistance to go to rehab.  Pt on an Ativan protocol per nurses.  [facility] has information to review

## 2011-03-14 NOTE — Progress Notes (Signed)
Pt presents in ED and initially was accepted to [facility] for SA treatment with suicidal ideation.  When ACT spoke with him earlier today he recanted his desire to hurt self, but wanted help with detox and a path to rehab.  The initial assessment indicated a plan to overdose, but pt denies plan or intent at the time ACT spoke with him.  Pt called in a [facility] Threat to the [facility] while in [facility] ED and in scrubs.  Pt apparently used his cell phone and hid phone in crevice of his buttox.  [employer] was able to trace phone call origins and asked pt if he could be searched and pt consented and that is where they found the phone.  Since pt is voluntary and was requesting help with his tx needs it was deemed appropriate for pt to be discharged from [facility] and [employer] will be taken to jail.  Discussion was had over whether it was more appropriate for pt to get detox tx first at [facility] and then go to jail or just go directly to jail.  ACT spoke to [facility] at [facility] who spoke to the physician on-call at [facility] and the physician declined to accept pt to program due to failure for pt to demonstrate behavior needed to enter detox and recovery with desire to seek help and comply with program expectations and protocols.  Pt does not appear a safety risk to self and suicidal thoughts earlier are not present currently.  Pt will be discharged from [facility] and taken to jail due to communicating threats to hospital.

## 2011-03-14 NOTE — ED Notes (Signed)
[employer] in w/ pt

## 2011-03-14 NOTE — ED Notes (Signed)
Pt also reports rt chest pain since last night that is intermittant, w/ inspiration only

## 2011-03-14 NOTE — ED Notes (Signed)
[employer] in w/pt. 

## 2011-11-10 ENCOUNTER — Emergency Department (HOSPITAL_COMMUNITY)
Admission: EM | Admit: 2011-11-10 | Discharge: 2011-11-11 | Disposition: A | Discharge status: Home / Self Care | Payer: Self-pay | Attending: Emergency Medicine | Admitting: Emergency Medicine

## 2011-11-10 ENCOUNTER — Encounter (HOSPITAL_COMMUNITY): Payer: Self-pay | Admitting: Family Medicine

## 2011-11-10 DIAGNOSIS — M545 Low back pain, unspecified: Secondary | ICD-10-CM | POA: Insufficient documentation

## 2011-11-10 DIAGNOSIS — F17200 Nicotine dependence, unspecified, uncomplicated: Secondary | ICD-10-CM | POA: Insufficient documentation

## 2011-11-10 NOTE — ED Notes (Signed)
Pt reports lower back pain worse with certain movements x3 days. States he has been having the back pain intermittently for several weeks but this is the longest it has gone.  States he has taken tylenol and ibuprofen at home with no relief.  Denies any known injury.

## 2011-11-11 ENCOUNTER — Emergency Department (HOSPITAL_COMMUNITY): Payer: Self-pay

## 2011-11-11 MED ORDER — HYDROCODONE-ACETAMINOPHEN 7.5-500 MG/15ML PO SOLN: 15.0000 mL | Freq: Four times a day (QID) | ORAL | Status: AC | PRN

## 2011-11-11 MED ORDER — CYCLOBENZAPRINE HCL 10 MG PO TABS
5.0000 mg | ORAL_TABLET | Freq: Once | ORAL | Status: AC
  Administered 2011-11-11: 5 mg via ORAL
  Filled 2011-11-11: qty 1

## 2011-11-11 MED ORDER — HYDROMORPHONE HCL PF 1 MG/ML IJ SOLN
1.0000 mg | Freq: Once | INTRAMUSCULAR | Status: AC
Start: 2011-11-11 — End: 2011-11-11
  Administered 2011-11-11: 1 mg via INTRAMUSCULAR
  Filled 2011-11-11: qty 1

## 2011-11-11 MED ORDER — HYDROMORPHONE HCL PF 1 MG/ML IJ SOLN
1.0000 mg | Freq: Once | INTRAMUSCULAR | Status: AC
  Administered 2011-11-11: 1 mg via INTRAMUSCULAR
  Filled 2011-11-11: qty 1

## 2011-11-11 MED ORDER — IBUPROFEN 800 MG PO TABS
800.0000 mg | ORAL_TABLET | Freq: Once | ORAL | Status: AC
  Administered 2011-11-11: 800 mg via ORAL
  Filled 2011-11-11: qty 1

## 2011-11-11 MED ORDER — CYCLOBENZAPRINE HCL 10 MG PO TABS: 10.0000 mg | ORAL_TABLET | Freq: Two times a day (BID) | ORAL | Status: AC | PRN

## 2011-11-11 MED ORDER — IBUPROFEN 800 MG PO TABS: 800.0000 mg | ORAL_TABLET | Freq: Three times a day (TID) | ORAL | Status: AC

## 2011-11-11 NOTE — ED Provider Notes (Signed)
History     CSN: [id number]  Arrival date & time 11/10/11  2134   First MD Initiated Contact with Patient 11/11/11 0001      Chief Complaint  Patient presents with  . Back Pain    (Consider location/radiation/quality/duration/timing/severity/associated sxs/prior treatment) HPI Hx per PT. LBP x 3 days - constant, has been on and off last 3 weeks, denies any recent heavy lifting, fall or trauma. Works as a Administrator. Pain is sharp non radiating, no weakness or numbness. No incontinence. Worse with sitting and movement, twisting/ bending. Has not tried any home medications. No DM, no fevers, no rash.  History reviewed. No pertinent past medical history.  History reviewed. No pertinent past surgical history.  History reviewed. No pertinent family history.  History  Substance Use Topics  . Smoking status: Current Everyday Smoker -- 0.5 packs/day  . Smokeless tobacco: Not on file  . Alcohol Use: Yes     occasionally      Review of Systems  Constitutional: Negative for fever and chills.  HENT: Negative for neck pain and neck stiffness.   Eyes: Negative for pain.  Respiratory: Negative for shortness of breath.   Cardiovascular: Negative for chest pain.  Gastrointestinal: Negative for abdominal pain.  Genitourinary: Negative for dysuria.  Musculoskeletal: Positive for back pain.  Skin: Negative for rash.  Neurological: Negative for headaches.  All other systems reviewed and are negative.    Allergies  Review of patient's allergies indicates no known allergies.  Home Medications   Current Outpatient Rx  Name Route Sig Dispense Refill  . ACETAMINOPHEN 325 MG PO TABS Oral Take 650 mg by mouth every 6 (six) hours as needed. For pain    . IBUPROFEN 200 MG PO TABS Oral Take 800 mg by mouth every 8 (eight) hours as needed. For pain      BP 130/69  Pulse 76  Temp 97.8 F (36.6 C) (Oral)  Resp 22  Ht 6' (1.829 m)  Wt 162 lb (73.483 kg)  BMI 21.97 kg/m2  SpO2  98%  Physical Exam  Constitutional: He is oriented to person, place, and time. He appears well-developed and well-nourished.  HENT:  Head: Normocephalic and atraumatic.  Eyes: Conjunctivae and EOM are normal. Pupils are equal, round, and reactive to light.  Neck: Trachea normal. Neck supple. No thyromegaly present.  Cardiovascular: Normal rate, regular rhythm, S1 normal, S2 normal and normal pulses.     No systolic murmur is present   No diastolic murmur is present  Pulses:      Radial pulses are 2+ on the right side, and 2+ on the left side.  Pulmonary/Chest: Effort normal and breath sounds normal. He has no wheezes. He has no rhonchi. He has no rales. He exhibits no tenderness.  Abdominal: Soft. Normal appearance and bowel sounds are normal. There is no tenderness. There is no CVA tenderness and negative Murphy's sign.  Musculoskeletal:       TTP over lower lumbar spine without deformity. No LE deficits with DTRs, strengths and sensorium to light touch equal / intact  Neurological: He is alert and oriented to person, place, and time. He has normal strength. No cranial nerve deficit or sensory deficit. GCS eye subscore is 4. GCS verbal subscore is 5. GCS motor subscore is 6.  Skin: Skin is warm and dry. No rash noted. He is not diaphoretic.  Psychiatric: His speech is normal.       Cooperative and appropriate    ED Course  Procedures (including critical care time)  Dg Lumbar Spine Complete  11/11/2011  *RADIOLOGY REPORT*  Clinical Data: Low back pain radiating to right hip and leg for 3 days  LUMBAR SPINE - COMPLETE 4+ VIEW  Comparison: None  Findings: Five non-rib bearing lumbar vertebrae. Osseous mineralization normal for technique. Vertebral body and disc space heights maintained. No acute fracture, subluxation or bone destruction. No spondylolysis. SI joints symmetric.  IMPRESSION: Normal exam.   Original Report Authenticated By: Lollie Marrow, M.D.    Xray as above   Improved  with dilaudid, motrin and flexeril  Plan outpatient follow up with strict return precautions for LBP verbalized as understood.   MDM   VS and nursing notes reviewed. IV narcotics. Imaging reviewed as above        Sunnie Nielsen, MD 11/11/11 0330

## 2012-03-14 ENCOUNTER — Emergency Department (HOSPITAL_COMMUNITY)
Admission: EM | Admit: 2012-03-14 | Discharge: 2012-03-14 | Disposition: A | Discharge status: Home / Self Care | Payer: Self-pay | Attending: Emergency Medicine | Admitting: Emergency Medicine

## 2012-03-14 DIAGNOSIS — IMO0002 Reserved for concepts with insufficient information to code with codable children: Secondary | ICD-10-CM

## 2012-03-14 DIAGNOSIS — I10 Essential (primary) hypertension: Secondary | ICD-10-CM | POA: Insufficient documentation

## 2012-03-14 DIAGNOSIS — Y929 Unspecified place or not applicable: Secondary | ICD-10-CM | POA: Insufficient documentation

## 2012-03-14 DIAGNOSIS — S66909A Unspecified injury of unspecified muscle, fascia and tendon at wrist and hand level, unspecified hand, initial encounter: Secondary | ICD-10-CM | POA: Insufficient documentation

## 2012-03-14 DIAGNOSIS — W268XXA Contact with other sharp object(s), not elsewhere classified, initial encounter: Secondary | ICD-10-CM | POA: Insufficient documentation

## 2012-03-14 DIAGNOSIS — F17200 Nicotine dependence, unspecified, uncomplicated: Secondary | ICD-10-CM | POA: Insufficient documentation

## 2012-03-14 DIAGNOSIS — Z79899 Other long term (current) drug therapy: Secondary | ICD-10-CM | POA: Insufficient documentation

## 2012-03-14 DIAGNOSIS — S5420XA Injury of radial nerve at forearm level, unspecified arm, initial encounter: Secondary | ICD-10-CM | POA: Insufficient documentation

## 2012-03-14 DIAGNOSIS — Y9389 Activity, other specified: Secondary | ICD-10-CM | POA: Insufficient documentation

## 2012-03-14 DIAGNOSIS — S61509A Unspecified open wound of unspecified wrist, initial encounter: Secondary | ICD-10-CM | POA: Insufficient documentation

## 2012-03-14 DIAGNOSIS — W2209XA Striking against other stationary object, initial encounter: Secondary | ICD-10-CM | POA: Insufficient documentation

## 2012-03-14 MED ORDER — CEPHALEXIN 500 MG PO CAPS: 500.0000 mg | ORAL_CAPSULE | Freq: Four times a day (QID) | ORAL | Status: DC

## 2012-03-14 MED ORDER — OXYCODONE-ACETAMINOPHEN 5-325 MG PO TABS: 2.0000 | ORAL_TABLET | ORAL | Status: DC | PRN

## 2012-03-14 NOTE — ED Provider Notes (Addendum)
History     CSN: [id number]  Arrival date & time 03/14/12  0147   First MD Initiated Contact with Patient 03/14/12 0355      Chief Complaint  Patient presents with  . Laceration    (Consider location/radiation/quality/duration/timing/severity/associated sxs/prior treatment) HPI Comments: Caught L wrist on metal shelf now with 1 CM laceration to medial aspect with pain radiating to thumb and 2nd finger  The history is provided by the patient.    No past medical history on file.  No past surgical history on file.  No family history on file.  History  Substance Use Topics  . Smoking status: Current Every Day Smoker -- 0.5 packs/day  . Smokeless tobacco: Not on file  . Alcohol Use: Yes     Comment: occasionally      Review of Systems  Constitutional: Negative for fever.  HENT: Negative.   Eyes: Negative.   Respiratory: Negative.   Cardiovascular: Negative.   Gastrointestinal: Negative.   Genitourinary: Negative.   Musculoskeletal: Negative for joint swelling.  Skin: Positive for wound.  Neurological: Positive for numbness. Negative for weakness.    Allergies  Hydrocodone  Home Medications   Current Outpatient Rx  Name  Route  Sig  Dispense  Refill  . SIMVASTATIN 5 MG PO TABS   Oral   Take 5 mg by mouth daily.         . CEPHALEXIN 500 MG PO CAPS   Oral   Take 1 capsule (500 mg total) by mouth 4 (four) times daily.   40 capsule   0   . OXYCODONE-ACETAMINOPHEN 5-325 MG PO TABS   Oral   Take 2 tablets by mouth every 4 (four) hours as needed for pain.   50 tablet   0     BP 137/92  Pulse 104  Temp 98.4 F (36.9 C) (Oral)  Resp 20  SpO2 93%  Physical Exam  Constitutional: He appears well-developed and well-nourished.  HENT:  Head: Normocephalic.  Eyes: Pupils are equal, round, and reactive to light.  Neck: Normal range of motion.  Cardiovascular: Normal rate.   Pulmonary/Chest: Effort normal.  Abdominal: Soft.  Musculoskeletal: He  exhibits no edema and no tenderness.  Neurological: He is alert.  Skin: Skin is warm.       1 cm laceration to medial aspect L wrist     ED Course  LACERATION REPAIR Date/Time: 03/14/2012 8:05 PM Performed by: Arman Filter Authorized by: Arman Filter Consent: Verbal consent obtained. Risks and benefits: risks, benefits and alternatives were discussed Consent given by: patient Patient understanding: patient states understanding of the procedure being performed Time out: Immediately prior to procedure a "time out" was called to verify the correct patient, procedure, equipment, support staff and site/side marked as required. Body area: upper extremity Location details: left upper arm Laceration length: 1 cm Foreign bodies: metal Tendon involvement: complex Nerve involvement: complex Vascular damage: no Anesthesia: local infiltration Anesthetic total: 2 ml Patient sedated: no Irrigation solution: saline Debridement: none Degree of undermining: none Comments: Laceration not sutured after numbed and examined further Dr. Amanda Pea called for evaluation    (including critical care time)  Labs Reviewed - No data to display No results found.   1. Radial nerve injury   2. Laceration   3. Tendon laceration       MDM  Dr. Amanda Pea to repair  After patient numbed and wound explored  Tendon was found to be lacerated as well as Radial nerve  Arman Filter, NP 03/14/12 0544  Arman Filter, NP 03/25/12 2008

## 2012-03-14 NOTE — Consult Note (Signed)
NAME:  [place], [place] Bernard NO.:  [id number]  MEDICAL RECORD NO.:  [id number]  LOCATION:  D30C                         FACILITY:  [facility]  PHYSICIAN:  Dionne Ano. Laquentin Loudermilk, M.D.DATE OF BIRTH:  1985-09-13  DATE OF CONSULTATION: DATE OF DISCHARGE:                                CONSULTATION   HISTORY OF PRESENT ILLNESS:  I had a pleasure seeing [place] Bernard. He is a 27 year old male with no significant past medical history that pertains to his upper extremity predicament tonight.  He was at work tonight and sustained a laceration to his right dorsal radial wrist.  He had immediate a shock-like sensation with pain.  He was seen by emergency room staff and I have been asked to see him and take over his care given the complex laceration.  He has a complex laceration about his wrist with numbness and tingling and poor function.  I have discussed with him these issues at length.  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  The patient is awake, alert, and oriented.  He is appropriate and cooperative.  PAST MEDICAL HISTORY:  Hypercholesterolemia.  PAST SURGICAL HISTORY:  Reviewed.  MEDICINES:  None, although he is taken a cholesterol-lowering medicine in the past.  He is currently not taking it.  ALLERGIES:  Hydrocodone caused a rash.  SOCIAL HISTORY:  He is a smoker.  He has a very young daughter who lives in [place] who is currently with him and his mother.  EXAMINATION:  GENERAL:  A pleasant male, alert, and oriented, in no acute distress. VITAL SIGNS:  Stable. HEENT:  Normal. CHEST:  Clear.  He does have equal breath sounds. ABDOMEN:  Nontender. HEART:  Regular rate. EXTREMITIES:  Left upper extremity is neurovascularly intact.  Normal alignment, stability, range of motion.  His right upper extremity has a semilunar laceration, dorsal radially over the 1st dorsal  compartment. He has what appears to be nerve and tendon injury.  I have reviewed this at length.  There is mild active bleeding from the wound.  His elbow, forearm and wrist, stable ligamentous examination.  There is no palpable bony deformity.  RADIOLOGY:  X-rays not taken.  I have reviewed his chart at length.  IMPRESSION:  Complex laceration, right wrist.  PLAN:  I have consented for I and D and exploration.  He was given a field block with lidocaine with epinephrine under sterile conditions.  PROCEDURE:  The patient underwent irrigation and debridement of skin and subcutaneous tissue tendon and paratendon tissue. He has an extensor injury.  He also has a lacerated superficial radial nerve.  I have reviewed this with him at length.  The patient has significant pain in the area in question.  In addition to this, has certain disarray of the soft tissues.  Following an exploration and review, we performed a copious irrigation followed by the loose closure of the wound and we will set him up for elective surgical intervention.  IMPRESSION:  Status post on-the-job injury with laceration to the right wrist involving tendon and nerve.  Will need a definitive surgical  reconstruction in a controlled surgical theater in the future.  PLAN:  I have discussed the patient's findings at present time.  We are going to recommend surgical fixation in the operative arena, where we can have perfect control of the hemostasis and a more meticulous nerve repair.  I feel for the nerve repair, he would be best suited in a controlled environment where we can give him the most precise nerve repair as this is a very finicky and tricky never in terms of its outcome in recovery/prognosis.  I have discussed the risks and benefits of bleeding, infection, anesthesia, damage to normal structures, and failure of surgery to accomplish its intended goals of relieving symptoms and restoring function.  With  this in mind, he desires to proceed.  I am going to go ahead and plan to place him on vitamin C 1000 mg a day, vitamin B6 200 mg a day, Percocet p.r.n. pain which he has tolerated well in the past, Keflex 500 q.i.d. x10 days and Peri-Colace to prevent constipation while on the pain medicine.  I have discussed with him the risks and benefits, do's and don'ts.  We will go ahead and gain Worker's Compensation approval for his proposed surgery.  He left the ER awake, alert and oriented and understands that our office will call him to arrange.  Should any problems arise he will notify us.  He was dressed with sterile bandage and thumb spica splint, which I applied myself.  It was a pleasure seeing him today, he seems to be a very nice young gentleman.  He is going to require surgical reconstruction.  He was finished with the early a.m. procedure by myself at 5:30 a.m. on will subsequently be discontinued and well arrange for his followup aftercare including definitive surgical reconstruction.     Dionne Ano. Amanda Pea, M.D.     [facility]  D:  03/14/2012  T:  03/14/2012  Job:  [id number]

## 2012-03-14 NOTE — ED Notes (Signed)
Sleeping, NAD, calm, pending EDP eval/ assessment.

## 2012-03-14 NOTE — Discharge Summary (Signed)
  Please see full dictated consult   diagnosis: laceration with nerve and tendon injury right wrist secondary to on-the-job laceration  Dominica Severin  MD

## 2012-03-14 NOTE — Consult Note (Signed)
  See dictation [id number] Dominica Severin MD

## 2012-03-14 NOTE — ED Provider Notes (Signed)
Medical screening examination/treatment/procedure(s) were performed by non-physician practitioner and as supervising physician I was immediately available for consultation/collaboration.   Glynn Octave, MD 03/14/12 [phone]

## 2012-03-14 NOTE — ED Notes (Addendum)
Pt arrived via [facility] c/o 1 inch deep LAC to rt wrist, with numbness in rt thumb. Bleeding controlled by time EMS arrived on site. Pt was cleaning a greasy prep table and got cut by jagged edge. Last tetanus shot in 2010. Hx: HTN

## 2012-03-14 NOTE — ED Notes (Signed)
EDNP finished at [facility], pending hand surgeon arrival (Dr. Amanda Pea), pt calm, NAD, alert.  Hand tray and suture cart at [facility].  Ortho tech paged/ notified.

## 2012-03-14 NOTE — ED Notes (Signed)
Suture cart placed in pt room for MD use

## 2012-03-14 NOTE — ED Notes (Signed)
Dr. Gramig at [facility] 

## 2012-03-15 ENCOUNTER — Encounter (HOSPITAL_COMMUNITY): Payer: Self-pay | Admitting: Pharmacy Technician

## 2012-03-16 ENCOUNTER — Encounter (HOSPITAL_COMMUNITY): Payer: Self-pay

## 2012-03-16 ENCOUNTER — Other Ambulatory Visit: Payer: Self-pay | Admitting: Orthopedic Surgery

## 2012-03-17 ENCOUNTER — Encounter (HOSPITAL_COMMUNITY): Payer: Self-pay | Admitting: Anesthesiology

## 2012-03-17 ENCOUNTER — Ambulatory Visit (HOSPITAL_COMMUNITY)
Admission: RE | Admit: 2012-03-17 | Discharge: 2012-03-17 | Disposition: A | Discharge status: Home / Self Care | Payer: Self-pay | Source: Ambulatory Visit | Attending: Orthopedic Surgery | Admitting: Orthopedic Surgery

## 2012-03-17 ENCOUNTER — Ambulatory Visit (HOSPITAL_COMMUNITY): Payer: Self-pay | Admitting: Anesthesiology

## 2012-03-17 ENCOUNTER — Encounter (HOSPITAL_COMMUNITY)
Admission: RE | Disposition: A | Discharge status: Home / Self Care | Payer: Self-pay | Source: Ambulatory Visit | Attending: Orthopedic Surgery

## 2012-03-17 DIAGNOSIS — S61511A Laceration without foreign body of right wrist, initial encounter: Secondary | ICD-10-CM

## 2012-03-17 DIAGNOSIS — S66909A Unspecified injury of unspecified muscle, fascia and tendon at wrist and hand level, unspecified hand, initial encounter: Secondary | ICD-10-CM | POA: Insufficient documentation

## 2012-03-17 DIAGNOSIS — S5420XA Injury of radial nerve at forearm level, unspecified arm, initial encounter: Secondary | ICD-10-CM | POA: Insufficient documentation

## 2012-03-17 DIAGNOSIS — W269XXA Contact with unspecified sharp object(s), initial encounter: Secondary | ICD-10-CM | POA: Insufficient documentation

## 2012-03-17 DIAGNOSIS — S61509A Unspecified open wound of unspecified wrist, initial encounter: Secondary | ICD-10-CM | POA: Insufficient documentation

## 2012-03-17 HISTORY — DX: Other specified health status: Z78.9

## 2012-03-17 HISTORY — PX: TENDON REPAIR: SHX5111

## 2012-03-17 LAB — CBC
MCV: 95.1 fL (ref 78.0–100.0)
Platelets: 228 10*3/uL (ref 150–400)
RBC: 4.46 MIL/uL (ref 4.22–5.81)
RDW: 12.8 % (ref 11.5–15.5)
WBC: 8.1 10*3/uL (ref 4.0–10.5)

## 2012-03-17 LAB — SURGICAL PCR SCREEN: MRSA, PCR: NEGATIVE

## 2012-03-17 SURGERY — TENDON REPAIR
Anesthesia: General | Site: Wrist | Laterality: Right | Wound class: Clean

## 2012-03-17 MED ORDER — OXYCODONE HCL 5 MG PO TABS: 5.0000 mg | ORAL_TABLET | Freq: Once | ORAL | Status: DC | PRN

## 2012-03-17 MED ORDER — CEFAZOLIN SODIUM-DEXTROSE 2-3 GM-% IV SOLR
2.0000 g | INTRAVENOUS | Status: AC
  Administered 2012-03-17: 2 g via INTRAVENOUS

## 2012-03-17 MED ORDER — MUPIROCIN 2 % EX OINT
TOPICAL_OINTMENT | Freq: Once | CUTANEOUS | Status: AC
  Administered 2012-03-17: 1 via NASAL

## 2012-03-17 MED ORDER — ONDANSETRON HCL 4 MG/2ML IJ SOLN: 4.0000 mg | Freq: Once | INTRAMUSCULAR | Status: DC | PRN

## 2012-03-17 MED ORDER — LIDOCAINE HCL (CARDIAC) 20 MG/ML IV SOLN
INTRAVENOUS | Status: DC | PRN
  Administered 2012-03-17: 7 mg via INTRAVENOUS

## 2012-03-17 MED ORDER — MEPERIDINE HCL 25 MG/ML IJ SOLN: 6.2500 mg | INTRAMUSCULAR | Status: DC | PRN

## 2012-03-17 MED ORDER — OXYCODONE HCL 5 MG/5ML PO SOLN: 5.0000 mg | Freq: Once | ORAL | Status: DC | PRN

## 2012-03-17 MED ORDER — BUPIVACAINE-EPINEPHRINE PF 0.5-1:200000 % IJ SOLN
INTRAMUSCULAR | Status: DC | PRN
  Administered 2012-03-17: 30 mL

## 2012-03-17 MED ORDER — SODIUM CHLORIDE 0.45 % IV SOLN: INTRAVENOUS | Status: DC

## 2012-03-17 MED ORDER — HYDROMORPHONE HCL PF 1 MG/ML IJ SOLN: 0.2500 mg | INTRAMUSCULAR | Status: DC | PRN

## 2012-03-17 MED ORDER — CEFAZOLIN SODIUM-DEXTROSE 2-3 GM-% IV SOLR
INTRAVENOUS | Status: AC
  Filled 2012-03-17: qty 50

## 2012-03-17 MED ORDER — FENTANYL CITRATE 0.05 MG/ML IJ SOLN
INTRAMUSCULAR | Status: AC
  Administered 2012-03-17: 100 ug via INTRAVENOUS
  Filled 2012-03-17: qty 2

## 2012-03-17 MED ORDER — MIDAZOLAM HCL 2 MG/2ML IJ SOLN
INTRAMUSCULAR | Status: AC
  Administered 2012-03-17: 2 mg
  Filled 2012-03-17: qty 2

## 2012-03-17 MED ORDER — MIDAZOLAM HCL 5 MG/ML IJ SOLN
2.0000 mg | Freq: Once | INTRAMUSCULAR | Status: AC
  Administered 2012-03-17: 2 mg via INTRAVENOUS

## 2012-03-17 MED ORDER — PROPOFOL 10 MG/ML IV BOLUS
INTRAVENOUS | Status: DC | PRN
  Administered 2012-03-17: 200 mg via INTRAVENOUS

## 2012-03-17 MED ORDER — LACTATED RINGERS IV SOLN
INTRAVENOUS | Status: DC | PRN
  Administered 2012-03-17: 17:00:00 via INTRAVENOUS

## 2012-03-17 MED ORDER — LACTATED RINGERS IV SOLN
INTRAVENOUS | Status: DC
  Administered 2012-03-17: 50 mL/h via INTRAVENOUS

## 2012-03-17 MED ORDER — MUPIROCIN 2 % EX OINT
TOPICAL_OINTMENT | CUTANEOUS | Status: AC
  Filled 2012-03-17: qty 22

## 2012-03-17 MED ORDER — CHLORHEXIDINE GLUCONATE 4 % EX LIQD: 60.0000 mL | Freq: Once | CUTANEOUS | Status: DC

## 2012-03-17 MED ORDER — LACTATED RINGERS IV SOLN: INTRAVENOUS | Status: DC

## 2012-03-17 MED ORDER — FENTANYL CITRATE 0.05 MG/ML IJ SOLN
100.0000 ug | Freq: Once | INTRAMUSCULAR | Status: AC
  Administered 2012-03-17: 100 ug via INTRAVENOUS

## 2012-03-17 MED ORDER — FENTANYL CITRATE 0.05 MG/ML IJ SOLN
INTRAMUSCULAR | Status: DC | PRN
  Administered 2012-03-17: 50 ug via INTRAVENOUS

## 2012-03-17 SURGICAL SUPPLY — 51 items
BANDAGE ELASTIC 3 VELCRO ST LF (GAUZE/BANDAGES/DRESSINGS) ×2 IMPLANT
BANDAGE ELASTIC 4 VELCRO ST LF (GAUZE/BANDAGES/DRESSINGS) IMPLANT
BANDAGE GAUZE ELAST BULKY 4 IN (GAUZE/BANDAGES/DRESSINGS) IMPLANT
BNDG COHESIVE 1X5 TAN STRL LF (GAUZE/BANDAGES/DRESSINGS) IMPLANT
CLOTH BEACON ORANGE TIMEOUT ST (SAFETY) ×2 IMPLANT
CORDS BIPOLAR (ELECTRODE) ×2 IMPLANT
COVER SURGICAL LIGHT HANDLE (MISCELLANEOUS) ×2 IMPLANT
CUFF TOURNIQUET SINGLE 18IN (TOURNIQUET CUFF) ×2 IMPLANT
CUFF TOURNIQUET SINGLE 24IN (TOURNIQUET CUFF) IMPLANT
DECANTER SPIKE VIAL GLASS SM (MISCELLANEOUS) ×2 IMPLANT
DRAPE SURG 17X23 STRL (DRAPES) ×2 IMPLANT
DRSG ADAPTIC 3X8 NADH LF (GAUZE/BANDAGES/DRESSINGS) ×2 IMPLANT
GAUZE SPONGE 2X2 8PLY STRL LF (GAUZE/BANDAGES/DRESSINGS) IMPLANT
GAUZE XEROFORM 1X8 LF (GAUZE/BANDAGES/DRESSINGS) IMPLANT
GLOVE BIOGEL M STRL SZ7.5 (GLOVE) ×2 IMPLANT
GLOVE BIOGEL PI IND STRL 7.5 (GLOVE) ×1 IMPLANT
GLOVE BIOGEL PI INDICATOR 7.5 (GLOVE) ×1
GLOVE ECLIPSE 7.0 STRL STRAW (GLOVE) ×2 IMPLANT
GLOVE SS BIOGEL STRL SZ 8 (GLOVE) ×1 IMPLANT
GLOVE SUPERSENSE BIOGEL SZ 8 (GLOVE) ×1
GOWN PREVENTION PLUS XLARGE (GOWN DISPOSABLE) ×2 IMPLANT
GOWN STRL NON-REIN LRG LVL3 (GOWN DISPOSABLE) ×4 IMPLANT
GOWN STRL REIN XL XLG (GOWN DISPOSABLE) ×4 IMPLANT
KIT BASIN OR (CUSTOM PROCEDURE TRAY) ×2 IMPLANT
KIT ROOM TURNOVER OR (KITS) ×2 IMPLANT
MANIFOLD NEPTUNE II (INSTRUMENTS) ×2 IMPLANT
NEEDLE HYPO 25GX1X1/2 BEV (NEEDLE) IMPLANT
NS IRRIG 1000ML POUR BTL (IV SOLUTION) ×2 IMPLANT
PACK ORTHO EXTREMITY (CUSTOM PROCEDURE TRAY) ×2 IMPLANT
PAD ARMBOARD 7.5X6 YLW CONV (MISCELLANEOUS) ×4 IMPLANT
PAD CAST 4YDX4 CTTN HI CHSV (CAST SUPPLIES) ×1 IMPLANT
PADDING CAST COTTON 4X4 STRL (CAST SUPPLIES) ×1
PROTECTOR NERVE AXOGARD 3.5X40 (Nerve Graft) ×2 IMPLANT
SOLUTION BETADINE 4OZ (MISCELLANEOUS) ×2 IMPLANT
SPEAR EYE SURG WECK-CEL (MISCELLANEOUS) ×2 IMPLANT
SPECIMEN JAR SMALL (MISCELLANEOUS) ×2 IMPLANT
SPONGE GAUZE 2X2 STER 10/PKG (GAUZE/BANDAGES/DRESSINGS)
SPONGE GAUZE 4X4 12PLY (GAUZE/BANDAGES/DRESSINGS) ×2 IMPLANT
SPONGE SCRUB IODOPHOR (GAUZE/BANDAGES/DRESSINGS) ×2 IMPLANT
SUCTION FRAZIER TIP 10 FR DISP (SUCTIONS) IMPLANT
SUT ETHILON 9 0 BV130 4 (SUTURE) ×2 IMPLANT
SUT MERSILENE 4 0 P 3 (SUTURE) IMPLANT
SUT PROLENE 4 0 PS 2 18 (SUTURE) ×2 IMPLANT
SUT VIC AB 2-0 CT1 27 (SUTURE)
SUT VIC AB 2-0 CT1 TAPERPNT 27 (SUTURE) IMPLANT
SYR CONTROL 10ML LL (SYRINGE) IMPLANT
TOWEL OR 17X24 6PK STRL BLUE (TOWEL DISPOSABLE) ×2 IMPLANT
TOWEL OR 17X26 10 PK STRL BLUE (TOWEL DISPOSABLE) ×2 IMPLANT
TUBE CONNECTING 12X1/4 (SUCTIONS) IMPLANT
UNDERPAD 30X30 INCONTINENT (UNDERPADS AND DIAPERS) ×2 IMPLANT
WATER STERILE IRR 1000ML POUR (IV SOLUTION) ×2 IMPLANT

## 2012-03-17 NOTE — Op Note (Signed)
See Dictation #[id number] Dominica Severin MD

## 2012-03-17 NOTE — Anesthesia Procedure Notes (Addendum)
Anesthesia Regional Block:  Supraclavicular block  Pre-Anesthetic Checklist: ,, timeout performed, Correct Patient, Correct Site, Correct Laterality, Correct Procedure, Correct Position, site marked, Risks and benefits discussed,  Surgical consent,  Pre-op evaluation,  At surgeon's request and post-op pain management  Laterality: Right  Prep: chloraprep       Needles:  Injection technique: Single-shot  Needle Type: Echogenic Stimulator Needle     Needle Length: 9cm      Additional Needles:  Procedures: ultrasound guided (picture in chart) and nerve stimulator Supraclavicular block  Nerve Stimulator or Paresthesia:  Response: 0.4 mA,   Additional Responses:   Narrative:  Start time: 03/17/2012 3:05 PM End time: 03/17/2012 3:20 PM Injection made incrementally with aspirations every 5 mL.  Performed by: Personally  Anesthesiologist: Arta Bruce MD  Additional Notes: Monitors applied. Patient sedated. Sterile prep and drape,hand hygiene and sterile gloves were used. Relevant anatomy identified.Needle position confirmed.Local anesthetic injected incrementally after negative aspiration. Local anesthetic spread visualized around nerve(s). Vascular puncture avoided. No complications. Image printed for medical record.The patient tolerated the procedure well.       Supraclavicular block Procedure Name: LMA Insertion Date/Time: 03/17/2012 4:45 PM Performed by: Gwenyth Allegra Pre-anesthesia Checklist: Timeout performed, Emergency Drugs available, Patient identified, Suction available and Patient being monitored Patient Re-evaluated:Patient Re-evaluated prior to inductionOxygen Delivery Method: Circle system utilized Preoxygenation: Pre-oxygenation with 100% oxygen Intubation Type: IV induction LMA: LMA inserted LMA Size: 5.0 Number of attempts: 1 Placement Confirmation: positive ETCO2 and breath sounds checked- equal and bilateral Tube secured with: Tape Dental Injury: Teeth and  Oropharynx as per pre-operative assessment

## 2012-03-17 NOTE — Anesthesia Postprocedure Evaluation (Signed)
Anesthesia Post Note  Patient: [place]  Procedure(s) Performed: Procedure(s) (LRB): TENDON REPAIR (Right)  Anesthesia type: general  Patient location: PACU  Post pain: Pain level controlled  Post assessment: Patient's Cardiovascular Status Stable  Last Vitals:  Filed Vitals:   03/17/12 1948  BP: 130/97  Pulse: 78  Temp: 36.6 C  Resp: 16    Post vital signs: Reviewed and stable  Level of consciousness: sedated  Complications: No apparent anesthesia complications

## 2012-03-17 NOTE — Anesthesia Preprocedure Evaluation (Signed)

## 2012-03-17 NOTE — H&P (Signed)
PHOENYX PAULSEN is an 27 y.o. male.   Chief Complaint: laceration right wrist  HPI: .Patient presents for evaluation and treatment of the of their upper extremity predicament. The patient denies neck back chest or of abdominal pain. The patient notes that they have no lower extremity problems. The patient from primarily complains of the upper extremity pain noted.  Past Medical History  Diagnosis Date  . No pertinent past medical history     Past Surgical History  Procedure Date  . Dental extractions     History reviewed. No pertinent family history. Social History:  reports that he has been smoking.  He does not have any smokeless tobacco history on file. He reports that he drinks alcohol. He reports that he does not use illicit drugs.  Allergies:  Allergies  Allergen Reactions  . Hydrocodone Itching and Other (See Comments)    Skin burning    Medications Prior to Admission  Medication Sig Dispense Refill  . cephALEXin (KEFLEX) 500 MG capsule Take 1 capsule (500 mg total) by mouth 4 (four) times daily.  40 capsule  0  . oxyCODONE-acetaminophen (ROXICET) 5-325 MG per tablet Take 2 tablets by mouth every 4 (four) hours as needed for pain.  50 tablet  0  . simvastatin (ZOCOR) 5 MG tablet Take 5 mg by mouth daily.        No results found for this or any previous visit (from the past 48 hour(s)). No results found.  Review of Systems  Constitutional: Negative.   HENT: Negative.   Eyes: Negative.   Respiratory: Negative.   Cardiovascular: Negative.   Gastrointestinal: Negative.   Skin: Negative.   Neurological: Negative.     Height 6' (1.829 m), weight 77.111 kg (170 lb). Physical Exam R wrist laceration with nerve injury .Marland KitchenThe patient is alert and oriented in no acute distress the patient complains of pain in the affected upper extremity. The patient is noted to have a normal HEENT exam. Lung fields show equal chest expansion and no shortness of breath abdomen exam is  nontender without distention. Lower extremity examination does not show any fracture dislocation or blood clot symptoms. Pelvis is stable neck and back are stable and nontender  Assessment/Plan .Marland KitchenWe are planning surgery for your upper extremity. The risk and benefits of surgery include risk of bleeding infection anesthesia damage to normal structures and failure of the surgery to accomplish its intended goals of relieving symptoms and restoring function with this in mind we'll going to proceed. I have specifically discussed with the patient the pre-and postoperative regime and the does and don'ts and risk and benefits in great detail. Risk and benefits of surgery also include risk of dystrophy chronic nerve pain failure of the healing process to go onto completion and other inherent risks of surgery The relavent the pathophysiology of the disease/injury process, as well as the alternatives for treatment and postoperative course of action has been discussed in great detail with the patient who desires to proceed.  We will do everything in our power to help you (the patient) restore function to the upper extremity. Is a pleasure to see this patient today.   Martiza Speth III,Amalio Loe M 03/17/2012, 1:30 PM

## 2012-03-17 NOTE — Preoperative (Signed)
Beta Blockers   Reason not to administer Beta Blockers:Not Applicable 

## 2012-03-17 NOTE — Transfer of Care (Signed)
Immediate Anesthesia Transfer of Care Note  Patient: [place]  Procedure(s) Performed: Procedure(s) (LRB) with comments: TENDON REPAIR (Right) - Irrigation & Debridement and Repair Nerve and Tendon Right Wrist and Hand  Patient Location: PACU  Anesthesia Type:GA combined with regional for post-op pain  Level of Consciousness: awake and alert   Airway & Oxygen Therapy: Patient Spontanous Breathing and Patient connected to nasal cannula oxygen  Post-op Assessment: Report given to PACU RN and Post -op Vital signs reviewed and stable  Post vital signs: Reviewed and stable  Complications: No apparent anesthesia complications

## 2012-03-17 NOTE — Progress Notes (Signed)
Orthopedic Tech Progress Note Patient Details:  Joseph [place] [birth year] [id number]  Ortho Devices Type of Ortho Device: Arm sling Ortho Device/Splint Location: right arm Ortho Device/Splint Interventions: Application rn documented that Dr. Amanda Pea wants arm foam sling and that is what was applied  [place], [place] 03/17/2012, 7:18 PM

## 2012-03-18 ENCOUNTER — Encounter (HOSPITAL_COMMUNITY): Payer: Self-pay | Admitting: Orthopedic Surgery

## 2012-03-18 NOTE — Op Note (Signed)
NAME:  Joseph [place], Joseph Bernard NO.:  [id number]  MEDICAL RECORD NO.:  [id number]  LOCATION:  [facility]                         FACILITY:  [facility]  PHYSICIAN:  Dionne Ano. Yesmin Mutch, M.D.DATE OF BIRTH:  [birth year]  DATE OF PROCEDURE: DATE OF DISCHARGE:  03/17/2012                              OPERATIVE REPORT   PREOPERATIVE DIAGNOSIS:  Laceration, right wrist with superficial radial nerve injury and tendon injury.  POSTOPERATIVE DIAGNOSIS:  Laceration, right wrist with superficial radial nerve injury and tendon injury.  PROCEDURES: 1. Irrigation and debridement of skin, subcutaneous tissue, tendon and     paratenon tissue. 2. Debridement of EPB and APL tendons, this was a 10-15% tear,     requiring tenolysis, tenosynovectomy, and debridement.  It did not     require formal suture repair. 3. Superficial radial nerve repair with nerve tube protector, wrap     placement.  SURGEON:  Dionne Ano. Amanda Pea, M.D.  ASSISTANT:  Karie Chimera, P.A.-C.  COMPLICATION:  None.  ANESTHESIA:  General with preoperative block.  TOURNIQUET TIME:  Less than an hour.  INDICATIONS:  Pleasant male, injured at work with laceration.  I saw him at 4 a.m. on Monday morning.  Prepped him for surgery and he desires to proceed with the above-mentioned operative intervention.  OPERATIVE PROCEDURE:  The patient was seen by myself and Anesthesia.  He was taken to the operating suite, given a block preoperatively.  Prepped and draped in usual sterile fashion with Betadine scrub and paint about the right upper extremity.  Following this, time-out was called, pre and postop check list complete.  Sutures were removed from prior loose closure.  I incised and excised an ellipse of skin 1-2 mm in nature and following this, irrigated with skin and subcutaneous tissue.  I performed excisional debridement of skin, subcutaneous tissue, paratenon, tenon, this was performed with curette, knife, blade,  and scissor.  Following this, I performed a partial first dorsal compartmental release with tenosynovectomy of the EPB and APL tendons.  These had 10%-15% tear and were debrided, they did not require formal stitch.  Following this, I dissected and isolated the superficial radial nerve proximally and distally.  I then coapted the nerve with epineurial technique utilizing five epineurial sutures.  Following this, I placed an AxoGen nerve wrap (AxoGuard nerve wrap) around the superficial radial nerve after presoaking the nerve wrap.  This provided excellent installation about the nerve.  I sutured the nerve wrap so as to provide a protective barrier.  Following this, tourniquet was deflated, hemostasis was secured.  The wound was closed with Prolene.  He was placed in a splint.  He will return to see [place] in 12-14 days and will begin range of motion gently at that time.  Do's and don'ts have been discussed and all questions had been encouraged and answered.  He tolerated the procedure quite well.  I expect that he will have some degree of numbness would encourage any smoking cessation and in addition to this, encourage cautious observation of course.  Should any problems occur, he will notify us; otherwise, we will look forward to seeing him back in 12 days.  Do's and don'ts have been discussed and all  questions have been encouraged and answered.     Dionne Ano. Amanda Pea, M.D.     [facility]  D:  03/17/2012  T:  03/18/2012  Job:  [id number]

## 2012-03-26 NOTE — ED Provider Notes (Signed)
Medical screening examination/treatment/procedure(s) were performed by non-physician practitioner and as supervising physician I was immediately available for consultation/collaboration.  Cornesha Radziewicz, MD 03/26/12 1001 

## 2014-11-26 ENCOUNTER — Emergency Department (HOSPITAL_COMMUNITY): Payer: Self-pay

## 2014-11-26 ENCOUNTER — Emergency Department (HOSPITAL_COMMUNITY)
Admission: EM | Admit: 2014-11-26 | Discharge: 2014-11-26 | Disposition: A | Discharge status: Home / Self Care | Payer: Self-pay | Attending: Emergency Medicine | Admitting: Emergency Medicine

## 2014-11-26 ENCOUNTER — Encounter (HOSPITAL_COMMUNITY): Payer: Self-pay | Admitting: Emergency Medicine

## 2014-11-26 DIAGNOSIS — R42 Dizziness and giddiness: Secondary | ICD-10-CM | POA: Insufficient documentation

## 2014-11-26 DIAGNOSIS — H538 Other visual disturbances: Secondary | ICD-10-CM | POA: Insufficient documentation

## 2014-11-26 DIAGNOSIS — R5383 Other fatigue: Secondary | ICD-10-CM | POA: Insufficient documentation

## 2014-11-26 DIAGNOSIS — R200 Anesthesia of skin: Secondary | ICD-10-CM | POA: Insufficient documentation

## 2014-11-26 DIAGNOSIS — M791 Myalgia: Secondary | ICD-10-CM | POA: Insufficient documentation

## 2014-11-26 DIAGNOSIS — Z720 Tobacco use: Secondary | ICD-10-CM | POA: Insufficient documentation

## 2014-11-26 LAB — BASIC METABOLIC PANEL WITH GFR
Anion gap: 13 (ref 5–15)
BUN: 19 mg/dL (ref 6–20)
CO2: 23 mmol/L (ref 22–32)
Calcium: 9.5 mg/dL (ref 8.9–10.3)
Chloride: 99 mmol/L — ABNORMAL LOW (ref 101–111)
Creatinine, Ser: 0.79 mg/dL (ref 0.61–1.24)
GFR calc Af Amer: >60 mL/min
GFR calc non Af Amer: >60 mL/min
Glucose, Bld: 92 mg/dL (ref 65–99)
Potassium: 3.9 mmol/L (ref 3.5–5.1)
Sodium: 135 mmol/L (ref 135–145)

## 2014-11-26 LAB — RAPID URINE DRUG SCREEN, HOSP PERFORMED
Amphetamines: POSITIVE — AB
BARBITURATES: NOT DETECTED
Benzodiazepines: NOT DETECTED
COCAINE: NOT DETECTED
Opiates: NOT DETECTED
Tetrahydrocannabinol: NOT DETECTED

## 2014-11-26 LAB — I-STAT TROPONIN, ED: TROPONIN I, POC: 0 ng/mL (ref 0.00–0.08)

## 2014-11-26 LAB — CBC
HCT: 42.1 % (ref 39.0–52.0)
Hemoglobin: 14.3 g/dL (ref 13.0–17.0)
MCH: 31.8 pg (ref 26.0–34.0)
MCHC: 34 g/dL (ref 30.0–36.0)
MCV: 93.6 fL (ref 78.0–100.0)
Platelets: 235 K/uL (ref 150–400)
RBC: 4.5 MIL/uL (ref 4.22–5.81)
RDW: 13.4 % (ref 11.5–15.5)
WBC: 9.7 K/uL (ref 4.0–10.5)

## 2014-11-26 LAB — URINALYSIS, ROUTINE W REFLEX MICROSCOPIC
Glucose, UA: NEGATIVE mg/dL
Hgb urine dipstick: NEGATIVE
KETONES UR: 40 mg/dL — AB
LEUKOCYTES UA: NEGATIVE
NITRITE: NEGATIVE
PH: 6 (ref 5.0–8.0)
PROTEIN: NEGATIVE mg/dL
Specific Gravity, Urine: 1.034 — ABNORMAL HIGH (ref 1.005–1.030)
Urobilinogen, UA: 1 mg/dL (ref 0.0–1.0)

## 2014-11-26 LAB — ETHANOL

## 2014-11-26 NOTE — Discharge Instructions (Signed)
1. Medications:  usual home medications 2. Treatment: rest, drink plenty of fluids 3. Follow Up: please followup with your primary doctor for discussion of your diagnoses and further evaluation after today's visit; if you do not have a primary care doctor use the resource guide provided to find one; please return to the ER for high fever, severe headache, loss of consciousness, chest pain, shortness of breath, new or worsening symptoms   Dizziness  Dizziness means you feel unsteady or lightheaded. You might feel like you are going to pass out (faint). HOME CARE   Drink enough fluids to keep your pee (urine) clear or pale yellow.  Take your medicines exactly as told by your doctor. If you take blood pressure medicine, always stand up slowly from the lying or sitting position. Hold on to something to steady yourself.  If you need to stand in one place for a long time, move your legs often. Tighten and relax your leg muscles.  Have someone stay with you until you feel okay.  Do not drive or use heavy machinery if you feel dizzy.  Do not drink alcohol. GET HELP RIGHT AWAY IF:   You feel dizzy or lightheaded and it gets worse.  You feel sick to your stomach (nauseous), or you throw up (vomit).  You have trouble talking or walking.  You feel weak or have trouble using your arms, hands, or legs.  You cannot think clearly or have trouble forming sentences.  You have chest pain, belly (abdominal) pain, sweating, or you are short of breath.  Your vision changes.  You are bleeding.  You have problems from your medicine that seem to be getting worse. MAKE SURE YOU:   Understand these instructions.  Will watch your condition.  Will get help right away if you are not doing well or get worse. Document Released: 02/12/2011 Document Revised: 05/18/2011 Document Reviewed: 02/12/2011 [facility] Patient Information 2015 [place], [place]. This information is not intended to replace advice  given to you by your health care provider. Make sure you discuss any questions you have with your health care provider.   Emergency Department Resource Guide 1) Find a Doctor and Pay Out of Pocket Although you won't have to find out who is covered by your insurance plan, it is a good idea to ask around and get recommendations. You will then need to call the office and see if the doctor you have chosen will accept you as a new patient and what types of options they offer for patients who are self-pay. Some doctors offer discounts or will set up payment plans for their patients who do not have insurance, but you will need to ask so you aren't surprised when you get to your appointment.  2) Contact Your Local Health Department Not all health departments have doctors that can see patients for sick visits, but many do, so it is worth a call to see if yours does. If you don't know where your local health department is, you can check in your phone book. The CDC also has a tool to help you locate your state's health department, and many state websites also have listings of all of their local health departments.  3) Find a Walk-in Clinic If your illness is not likely to be very severe or complicated, you may want to try a walk in clinic. These are popping up all over the country in pharmacies, drugstores, and shopping centers. They're usually staffed by nurse practitioners or physician assistants that  have been trained to treat common illnesses and complaints. They're usually fairly quick and inexpensive. However, if you have serious medical issues or chronic medical problems, these are probably not your best option.  No Primary Care Doctor: - Call Health Connect at  [phone] - they can help you locate a primary care doctor that  accepts your insurance, provides certain services, etc. - Physician Referral Service- [phone]  Chronic Pain Problems: Organization         Address  Phone   Notes  [facility]  [phone] Patients need to be referred by their primary care doctor.   Medication Assistance: Organization         Address  Phone   Notes  [facility] Med[facility] [address] [place] [place], [place] [address] [phone] --Must be a resident of [facility] -- Must have NO insurance coverage whatsoever (no Medicaid/ Medicare, etc.) -- The pt. MUST have a primary care doctor that directs their care regularly and follows them in the community   [facility]ist  [phone]   [employer]  [phone]    Agencies that provide inexpensive medical care: Organization         Address  Phone   Notes  [facility]  [phone]   [facility]    [phone]   [facility] [address] [place], [place] [address] [phone]   [facility] [address] [place] [place] [phone]   Planned Parenthood    [phone]   [facility]    [phone]   Community Health and [facility]  [address], [place] Phone:  [phone], Fax:  [phone] Hours of Operation:  9 am - 6 pm, M-F.  Also accepts Medicaid/Medicare and self-pay.  [facility]  [address], [place] Phone: [phone], Fax: [phone]. Hours of Operation:  8:30 am - 5:30 pm, M-F.  Also accepts Medicaid and self-pay.  [facility] [place] [address], [place] Phone: [phone]   [facility] [address] Natasha Bence [place], [place] [phone], Ext. [phone] Mondays & Thursdays: 7-9 AM.  First 15 patients are seen on a first come, first serve basis.    Medicaid-accepting [facility] Providers:  Organization         Address  Phone   Notes  [facility] [address], [place] [phone] Also accepts self-pay patients.  [facility] [address] Laurell Josephs [place], [place]  [phone]   [facility] [address], [place] [phone]   [facility] [address], [place] [phone]   Renaye Rakers [address], [place]   [phone] Only accepts [place] Access [place] patients after they have their name applied to their card.   Self-Pay (no insurance) in [facility]:  Organization         Address  Phone   Notes  Sickle Cell Patients, [facility] Internal Medicine [address] [place], [place] [phone]   [facility] [address] [place], [place] [phone]   [facility] [place]  [address], [place] [phone]   [facility]/Dr. Osei-Bonsu  [address] [place] [address], [place]  or [address], [place] [phone] Phone number for both [facility] and [place] locations is the same.  Urgent Medical and [facility] [address], [place] [phone]   [facility] [address], [place] or [address] [phone]   [facility] [address], [place] [phone], phone; [phone], fax Sees patients 1st and 3rd Saturday of every month.  Must not qualify for public or private insurance (i.e. Medicaid, Medicare, [place] Health Choice, Veterans' Benefits)  Household income should be no more than 200% of the poverty level The clinic cannot treat you if you are pregnant or think you are pregnant  Sexually transmitted diseases are not treated at the clinic.    Dental Care: Organization         Address  Phone  Notes  [facility] Department of [facility] [address] [place], [place] [phone] Accepts children up to age 59 who are enrolled in [place] or [place] Health Choice; pregnant women with a Medicaid card; and children who have applied for  Medicaid or [place] Health Choice, but were declined, whose parents can pay a reduced fee at time of service.  [facility] Department of [facility]  [address], [place] [phone] Accepts children up to age 60 who are enrolled in [place] or [place] Health Choice; pregnant women with a Medicaid card; and children who have applied for Medicaid or [place] Health Choice, but were declined, whose parents can pay a reduced fee at time of service.  [facility]  [address] [place], [place] [phone] Patients are seen by appointment only. Walk-ins are not accepted. [facility] will see patients 78 years of age and older. Monday - Tuesday (8am-5pm) Most Wednesdays (8:30-5pm) $30 per visit, cash only  [facility] Adult Dental Access PROGRAM  [address], [facility] [phone] Patients are seen by appointment only. Walk-ins are not accepted. [facility] will see patients 64 years of age and older. One Wednesday Evening (Monthly: Volunteer Based).  $30 per visit, cash only  [employer]  [phone] for adults; Children under age 39, call Graduate Pediatric Dentistry at [phone]. Children aged 45-14, please call [phone] to request a pediatric application.  Dental services are provided in all areas of dental care including fillings, crowns and bridges, complete and partial dentures, implants, gum treatment, root canals, and extractions. Preventive care is also provided. Treatment is provided to both adults and children. Patients are selected via a lottery and there is often a waiting list.   [facility] [address] [place]  [phone] [email or web address]   [facility] [address] [place], [place] [phone], Ext. [phone] Second and Fourth Thursday of each month, opens at 6:30 AM; Clinic ends at 9 AM.  Patients are seen on a first-come first-served basis, and a limited number  are seen during each clinic.   [facility]  [address] Ether Griffins [place], [place] [phone]   Eligibility Requirements You must have lived in [place], [place], or [place] counties for at least the last three months.   You cannot be eligible for state or federal sponsored [place], including [employer], [place], or [employer].   You generally cannot be eligible for healthcare insurance through your employer.    How to apply: Eligibility  screenings are held every Tuesday and Wednesday afternoon from 1:00 pm until 4:00 pm. You do not need an appointment for the interview!  [facility] [address], [place], [place] [phone]   [facility] Health Department  [phone]   [facility] Health Department  [phone]   [facility] Health Department  [phone]    Behavioral Health Resources in the Community: Intensive Outpatient Programs Organization         Address  Phone  Notes  [facility] [address] [place], [place] [phone]   [facility] Outpatient [address], [place], [place] [phone]   [facility] [address], [place], [place]  [phone]   [facility] [address],  [place], [place] [phone] or [phone]   Substance Abuse Resources Organization         Address  Phone  Notes  [facility]  [phone]   [facility]  [phone]   [facility] [place]  [phone]   Floydene Flock  [phone]   Residential & Outpatient Substance Abuse Program  [phone]   Psychological Services Organization         Address  Phone  Notes  [facility] Behavioral Health  [phone]   [facility]  [phone]   [facility] [address], [place] [phone] or [phone]    Mobile Crisis Teams Organization         Address  Phone  Notes  Therapeutic  Alternatives, Mobile Crisis Care Unit  [phone]   [facility]  [address] [place], [place] [phone]   Doristine Locks [address] [place] [phone]    Self-Help/Support Groups Organization         Address  Phone             Notes  [facility] [place] - variety of support groups  336- I7437963 Call for more information  Narcotics Anonymous (NA), [facility] [address], [employer] [place]  2 meetings at this location   Statistician         Address  Phone  Notes  [facility] [address],    [place]  [phone]   [facility]  [address] [place] [address], [place], [place] [phone]   [facility] [address] [place], [place] [phone] Admissions: 8am-3pm M-F  [facility] [address],    [place], [place] [phone]   [facility] [address] [place], [place], [place] [phone]   [facility] [address],  [place], [place] [phone]   [facility] - Intensive Outpatient [address] Laurell [address], [place], [place] [phone]   [facility] ([facility]iction [facility] [address] [place].,  [place], [place] [phone] or [phone]   [facility] ([facility]) [address] [place], [place] [phone] Accepts Medicaid  [facility] [place] [address]  [place] [phone] Substance Abuse/Addiction Treatment   [facility] Organization         Address  Phone  Notes  [facility]  [phone]   Angie Fava, PhD [address] [place], [place]   [phone] or 6785716516   [facility] Behavioral   [address] [place], [place] [phone]   [facility] [address] [place], [place] [phone]  Insurance/Medicaid/sponsorship through [employer] and Families  [address]                                    [place], [place] [phone] Therapy/tele-psych/case  [facility] [address]   [place], [place] [phone]    Dr. Lolly Mustache  [phone]   [facility]  [facility] Dept. 1) [address] [place] 2) [address] [place] 3)  [address], [place] [phone]  [phone]   [facility] Child Abuse Hotline (709)578-3769 or [phone] (After Hours)

## 2014-11-26 NOTE — Progress Notes (Signed)
Pt confirms pcp is Dr Concepcion Elk Mother at bedside CM spoke with pt who confirms uninsured [employer] resident with pcp.  CM discussed and provided written information for uninsured accepting pcps, discussed the importance of pcp vs EDP services for f/u care, www.needymeds.org, www.goodrx.com, discounted pharmacies and other [employer] such as [employer] , [employer], affordable care act, financial assistance, uninsured dental services, [place] med assist, DSS and  health department  Reviewed resources for [employer] uninsured accepting pcps like Jovita Kussmaul, family medicine at [employer], [facility] [place], [facility], local urgent care centers, [facility], [facility] [address] family practice, general medical clinics, family services of the [place], [facility] urgent care plus others, medication resources, [facility] out patient pharmacies and housing Pt voiced understanding and appreciation of resources provided    Pt pleasant when Cm visited him Pt speaking and nodding He informs Cm he is now experiencing tingling and numbness in all fingers except his pointer finger. EDP/PA/NP came to assess pt during this time CM updated EDP/PA/NP on pt new voiced concerns

## 2014-11-26 NOTE — ED Provider Notes (Signed)
CSN: [id number]     Arrival date & time 11/26/14  1207 History   First MD Initiated Contact with Patient 11/26/14 1615     Chief Complaint  Patient presents with  . Numbness  . Blurred Vision    HPI   Joseph Bernard is a 29 y.o. male with no pertinent PMH who presents to the ED with dizziness, blurred vision, and numbness which started early this morning. 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 He states he was drinking a beer and smoking a cigarette around 4 AM this morning, and experienced sudden onset dizziness and blurred vision. He reports EMS was called. He states he was in the ambulance but subsequently got out of the ambulance and went inside his house. He states he experienced dizziness and blurred vision again around 9 AM this morning, and at that time he had numbness to his lower extremities, hands, and forearms bilaterally. He is unable to identify any precipitating or alleviating factors. He reports feeling more confused today than usual, and also states he feels like his tongue is getting in the way of his speech. He also reports right ankle pain, which he reports is exacerbated by movement. He denies recent injury. He states this has never happened to him before.    Past Medical History  Diagnosis Date  . No pertinent past medical history    Past Surgical History  Procedure Laterality Date  . Dental extractions    . Tendon repair  03/17/2012    Procedure: TENDON REPAIR;  Surgeon: Dominica Severin, MD;  Location: [facility] OR;  Service: Orthopedics;  Laterality: Right;  Irrigation & Debridement and Repair Nerve and Tendon Right Wrist and Hand   History reviewed. No pertinent family history. Social History  Substance Use Topics  . Smoking status: Current Every Day Smoker -- 0.50 packs/day  . Smokeless tobacco: None  . Alcohol Use: Yes     Comment: occasionally     Review of Systems  Constitutional: Positive for fatigue. Negative for fever, chills, activity change and  appetite change.  Eyes: Positive for visual disturbance.  Respiratory: Negative for shortness of breath.   Cardiovascular: Negative for chest pain.  Gastrointestinal: Negative for nausea, vomiting, abdominal pain, diarrhea and constipation.  Genitourinary: Negative for dysuria, urgency and frequency.  Musculoskeletal: Positive for arthralgias. Negative for myalgias, back pain, neck pain and neck stiffness.  Neurological: Positive for dizziness, speech difficulty, light-headedness and numbness. Negative for syncope, facial asymmetry, weakness and headaches.  All other systems reviewed and are negative.     Allergies  Hydrocodone  Home Medications   Prior to Admission medications   Medication Sig Start Date End Date Taking? Authorizing Provider  acetaminophen (TYLENOL) 325 MG tablet Take 650 mg by mouth every 6 (six) hours as needed for moderate pain.   Yes Historical Provider, MD  cephALEXin (KEFLEX) 500 MG capsule Take 1 capsule (500 mg total) by mouth 4 (four) times daily. Patient not taking: Reported on 11/26/2014 03/14/12   Dominica Severin, MD  oxyCODONE-acetaminophen (ROXICET) 5-325 MG per tablet Take 2 tablets by mouth every 4 (four) hours as needed for pain. Patient not taking: Reported on 11/26/2014 03/14/12   Dominica Severin, MD    BP 102/60 mmHg  Pulse 64  Temp(Src) 97.8 F (36.6 C) (Oral)  Resp 18  Wt 160 lb (72.576 kg)  SpO2 98% Physical Exam  Constitutional: He is oriented to person, place, and time. He appears well-developed and well-nourished. No distress.  HENT:  Head: Normocephalic  and atraumatic.  Right Ear: External ear normal.  Left Ear: External ear normal.  Nose: Nose normal.  Mouth/Throat: Uvula is midline, oropharynx is clear and moist and mucous membranes are normal.  Eyes: Conjunctivae, EOM and lids are normal. Pupils are equal, round, and reactive to light. Right eye exhibits no discharge. Left eye exhibits no discharge. No scleral icterus.  Neck: Normal  range of motion. Neck supple.  Cardiovascular: Normal rate, regular rhythm, normal heart sounds, intact distal pulses and normal pulses.   Pulmonary/Chest: Effort normal and breath sounds normal. No respiratory distress.  Abdominal: Soft. Normal appearance and bowel sounds are normal. He exhibits no distension and no mass. There is no tenderness. There is no rigidity, no rebound and no guarding.  Musculoskeletal: Normal range of motion. He exhibits no edema or tenderness.  TTP of right ankle with mild erythema.   Neurological: He is alert and oriented to person, place, and time. He has normal strength. No cranial nerve deficit or sensory deficit.  Skin: Skin is warm, dry and intact. No rash noted. He is not diaphoretic. No erythema. No pallor.  Psychiatric: He has a normal mood and affect. His speech is normal and behavior is normal. Judgment and thought content normal.  Nursing note and vitals reviewed.     Visual Acuity  Right Eye Distance:   Left Eye Distance:   Bilateral Distance:    Right Eye Near: R Near: 20/100 Left Eye Near:  L Near: 20/70 Bilateral Near:  20/100   ED Course  Procedures (including critical care time)  Labs Review Labs Reviewed  BASIC METABOLIC PANEL - Abnormal; Notable for the following:    Chloride 99 (*)    All other components within normal limits  URINALYSIS, ROUTINE W REFLEX MICROSCOPIC (NOT AT [facility]) - Abnormal; Notable for the following:    Color, Urine AMBER (*)    Specific Gravity, Urine 1.034 (*)    Bilirubin Urine SMALL (*)    Ketones, ur 40 (*)    All other components within normal limits  URINE RAPID DRUG SCREEN, HOSP PERFORMED - Abnormal; Notable for the following:    Amphetamines POSITIVE (*)    All other components within normal limits  CBC  ETHANOL  I-STAT TROPOININ, ED    Imaging Review Dg Ankle Complete Right  11/26/2014   CLINICAL DATA:  Acute right ankle pain and swelling. No known injury.  EXAM: RIGHT ANKLE - COMPLETE 3+  VIEW  COMPARISON:  10/23/2006  FINDINGS: There is no evidence of acute fracture, subluxation or dislocation.  Chronic appearing changes along the lateral distal tibia noted.  No suspicious focal bony lesions are noted.  The joint spaces are unremarkable.  IMPRESSION: No acute abnormalities.   Electronically Signed   By: Harmon Pier M.D.   On: 11/26/2014 19:26   Ct Head Wo Contrast  11/26/2014   CLINICAL DATA:  Nausea, BILATERAL numbness to hands and feet, BILATERAL blurred vision, intermittent onset yesterday, difficulty forming words today, slurred speech, has not been sleeping since Saturday methamphetamine use on Saturday, dizziness  EXAM: CT HEAD WITHOUT CONTRAST  TECHNIQUE: Contiguous axial images were obtained from the base of the skull through the vertex without intravenous contrast.  COMPARISON:  None  FINDINGS: Normal ventricular morphology.  No midline shift or mass effect.  Normal appearance of brain parenchyma.  No intracranial hemorrhage, mass lesion, or acute infarction.  Visualized paranasal sinuses and mastoid air cells clear.  Bones unremarkable.  IMPRESSION: Normal exam.   Electronically  Signed   By: Ulyses Southward M.D.   On: 11/26/2014 21:04     I have personally reviewed and evaluated these images and lab results as part of my medical decision-making.   EKG Interpretation   Date/Time:  Monday November 26 2014 21:16:39 EDT Ventricular Rate:  65 PR Interval:  130 QRS Duration: 97 QT Interval:  399 QTC Calculation: 415 R Axis:   77 Text Interpretation:  Sinus rhythm Probable left atrial enlargement ST  elev, probable normal early repol pattern Early repolarization Confirmed  by LITTLE MD, RACHEL ([id number]) on 11/26/2014 10:05:23 PM      MDM   Final diagnoses:  Dizziness  Numbness   29 year old male presents with dizziness, blurred vision, and numbness which started early this morning. Denies fever, chills, chest pain, shortness of breath, abdominal pain, N/V/D/C. Patient  reports using methamphetamine on Saturday.  Patient is afebrile. Vital signs stable. Normal neuro exam with no focal deficit. Patient ambulates without difficulty. Strength, sensation, DTRs intact. Heart regular rate and rhythm. Lungs clear to auscultation bilaterally. Abdomen soft, nontender, nondistended. Right ankle mildly tender to palpation. Full range of motion of right lower extremity. Distal pulses intact.  CBC, BMP unremarkable. Sinus tachycardia on initial EKG, repeat EKG with HR 65. Troponin negative x 1. UA with small bilirubin and ketones. UDS positive for amphetamines. Ethanol negative. Imaging of right ankle negative for acute abnormalities. Head CT negative for intracranial hemorrhage, mass lesion, or acute infarction.   Counseled patient on substance use. Patient to follow up with PCP, resource list given. Return precautions discussed. Patient in agreement with plan.  BP 102/60 mmHg  Pulse 64  Temp(Src) 97.8 F (36.6 C) (Oral)  Resp 18  Wt 160 lb (72.576 kg)  SpO2 98%    Mady Gemma, PA-C 11/27/14 0155  Laurence Spates, MD 11/28/14 1426
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
## 2015-04-15 ENCOUNTER — Emergency Department (HOSPITAL_COMMUNITY)
Admission: EM | Admit: 2015-04-15 | Discharge: 2015-04-15 | Disposition: A | Discharge status: Home / Self Care | Payer: Self-pay | Attending: Emergency Medicine | Admitting: Emergency Medicine

## 2015-04-15 ENCOUNTER — Encounter (HOSPITAL_COMMUNITY): Payer: Self-pay | Admitting: Emergency Medicine

## 2015-04-15 DIAGNOSIS — M5432 Sciatica, left side: Secondary | ICD-10-CM | POA: Insufficient documentation

## 2015-04-15 DIAGNOSIS — F17200 Nicotine dependence, unspecified, uncomplicated: Secondary | ICD-10-CM | POA: Insufficient documentation

## 2015-04-15 MED ORDER — TRAMADOL HCL 50 MG PO TABS: 50.0000 mg | ORAL_TABLET | Freq: Four times a day (QID) | ORAL | Status: DC | PRN

## 2015-04-15 MED ORDER — KETOROLAC TROMETHAMINE 30 MG/ML IJ SOLN
30.0000 mg | Freq: Once | INTRAMUSCULAR | Status: AC
  Administered 2015-04-15: 30 mg via INTRAMUSCULAR
  Filled 2015-04-15: qty 1

## 2015-04-15 MED ORDER — CYCLOBENZAPRINE HCL 5 MG PO TABS: 5.0000 mg | ORAL_TABLET | Freq: Three times a day (TID) | ORAL | Status: DC

## 2015-04-15 MED ORDER — PREDNISONE 20 MG PO TABS: ORAL_TABLET | ORAL | Status: DC

## 2015-04-15 MED ORDER — CYCLOBENZAPRINE HCL 10 MG PO TABS
5.0000 mg | ORAL_TABLET | Freq: Once | ORAL | Status: AC
  Administered 2015-04-15: 5 mg via ORAL
  Filled 2015-04-15: qty 1

## 2015-04-15 MED ORDER — TRAMADOL HCL 50 MG PO TABS
50.0000 mg | ORAL_TABLET | Freq: Once | ORAL | Status: AC
Start: 2015-04-15 — End: 2015-04-15
  Administered 2015-04-15: 50 mg via ORAL
  Filled 2015-04-15: qty 1

## 2015-04-15 NOTE — ED Provider Notes (Signed)
CSN: [id number]     Arrival date & time 04/15/15  1846 History  By signing my name below, I, Phillis Haggis, attest that this documentation has been prepared under the direction and in the presence of Earley Favor, NP-C. Electronically Signed: Phillis Haggis, ED Scribe. 04/15/2015. 8:13 PM.   Chief Complaint  Patient presents with  . Back Pain  The history is provided by the patient. No language interpreter was used.  HPI Comments: Joseph Bernard is a 30 y.o. male who presents to the Emergency Department complaining of mid and lower back pain that radiates to the left lower abdomen onset 3 days ago. Pt states that he was watching TV at home, got up, and the pain started. He states that it has been gradually worsening and aggravated when he was at work on Saturday. He reports that he has tried Tylenol and muscle relaxants to no relief. He reports worsening pain with twisting movements. He states that he has had similar pain in the past, but it has never been this bad. He denies fever, chills, appetite change, nausea, vomiting, abdominal pain, dysuria, bladder or bowel incontinence, testicular pain, testicular swelling, numbness, or weakness.   Past Medical History  Diagnosis Date  . No pertinent past medical history    Past Surgical History  Procedure Laterality Date  . Dental extractions    . Tendon repair  03/17/2012    Procedure: TENDON REPAIR;  Surgeon: Dominica Severin, MD;  Location: [facility] OR;  Service: Orthopedics;  Laterality: Right;  Irrigation & Debridement and Repair Nerve and Tendon Right Wrist and Hand   History reviewed. No pertinent family history. Social History  Substance Use Topics  . Smoking status: Current Every Day Smoker -- 0.50 packs/day  . Smokeless tobacco: None  . Alcohol Use: Yes     Comment: occasionally    Review of Systems  Constitutional: Negative for fever, chills and appetite change.  Gastrointestinal: Negative for nausea, vomiting, abdominal pain and  diarrhea.  Genitourinary: Negative for dysuria, frequency, scrotal swelling and testicular pain.  Musculoskeletal: Positive for back pain.  Skin: Negative for rash and wound.  Neurological: Negative for weakness and numbness.  All other systems reviewed and are negative.  Allergies  Hydrocodone  Home Medications   Prior to Admission medications   Medication Sig Start Date End Date Taking? Authorizing Provider  acetaminophen (TYLENOL) 325 MG tablet Take 650 mg by mouth every 6 (six) hours as needed for moderate pain.    Historical Provider, MD  cephALEXin (KEFLEX) 500 MG capsule Take 1 capsule (500 mg total) by mouth 4 (four) times daily. Patient not taking: Reported on 11/26/2014 03/14/12   Dominica Severin, MD  oxyCODONE-acetaminophen (ROXICET) 5-325 MG per tablet Take 2 tablets by mouth every 4 (four) hours as needed for pain. Patient not taking: Reported on 11/26/2014 03/14/12   Dominica Severin, MD   BP 130/94 mmHg  Pulse 74  Temp(Src) 98.4 F (36.9 C) (Oral)  Resp 18  SpO2 99% Physical Exam  Constitutional: He is oriented to person, place, and time. He appears well-developed and well-nourished.  HENT:  Head: Normocephalic.  Eyes: Pupils are equal, round, and reactive to light.  Neck: Normal range of motion.  Cardiovascular: Normal rate and regular rhythm.   Pulmonary/Chest: Effort normal and breath sounds normal.  Abdominal: Soft.  Musculoskeletal: He exhibits no edema or tenderness.       Back:  Neurological: He is alert and oriented to person, place, and time.  Skin: Skin is  warm.  Nursing note and vitals reviewed.   ED Course  Procedures (including critical care time) DIAGNOSTIC STUDIES: Oxygen Saturation is 99% on RA, normal by my interpretation.    COORDINATION OF CARE: 8:09 PM-Discussed treatment plan which includes muscle relaxants with pt at bedside and pt agreed to plan.    Labs Review Labs Reviewed - No data to display  Imaging Review No results found. I  have personally reviewed and evaluated these images and lab results as part of my medical decision-making.   EKG Interpretation None     From exam and history I believe this is sciatica  MDM   Final diagnoses:  None    I personally performed the services described in this documentation, which was scribed in my presence. The recorded information has been reviewed and is accurate.   Earley Favor, NP 04/15/15 1610  Arby Barrette, MD 04/22/15 1700

## 2015-04-15 NOTE — ED Notes (Signed)
Pt states that he has had lower/mid back pain x 3 days. Denies urinary symptoms. Alert and oriented.

## 2015-04-15 NOTE — Discharge Instructions (Signed)
You have been give 3 prescriptions one is for pain control--Ultram One is an antiinflammatory- Prednisone The third is a muscle relaxer Take these as directed Make an appointment with your PCP for follow up care

## 2015-12-29 ENCOUNTER — Emergency Department (HOSPITAL_COMMUNITY): Payer: No Typology Code available for payment source

## 2015-12-29 ENCOUNTER — Emergency Department (HOSPITAL_COMMUNITY)
Admission: EM | Admit: 2015-12-29 | Discharge: 2015-12-29 | Disposition: A | Discharge status: Home / Self Care | Payer: No Typology Code available for payment source | Attending: Emergency Medicine | Admitting: Emergency Medicine

## 2015-12-29 ENCOUNTER — Encounter (HOSPITAL_COMMUNITY): Payer: Self-pay

## 2015-12-29 DIAGNOSIS — Y999 Unspecified external cause status: Secondary | ICD-10-CM | POA: Diagnosis not present

## 2015-12-29 DIAGNOSIS — R079 Chest pain, unspecified: Secondary | ICD-10-CM | POA: Insufficient documentation

## 2015-12-29 DIAGNOSIS — R930 Abnormal findings on diagnostic imaging of skull and head, not elsewhere classified: Secondary | ICD-10-CM | POA: Insufficient documentation

## 2015-12-29 DIAGNOSIS — Y9389 Activity, other specified: Secondary | ICD-10-CM | POA: Diagnosis not present

## 2015-12-29 DIAGNOSIS — S60211A Contusion of right wrist, initial encounter: Secondary | ICD-10-CM | POA: Diagnosis not present

## 2015-12-29 DIAGNOSIS — T07XXXA Unspecified multiple injuries, initial encounter: Secondary | ICD-10-CM

## 2015-12-29 DIAGNOSIS — S301XXA Contusion of abdominal wall, initial encounter: Secondary | ICD-10-CM | POA: Insufficient documentation

## 2015-12-29 DIAGNOSIS — T1490XA Injury, unspecified, initial encounter: Secondary | ICD-10-CM

## 2015-12-29 DIAGNOSIS — S3991XA Unspecified injury of abdomen, initial encounter: Secondary | ICD-10-CM | POA: Diagnosis present

## 2015-12-29 DIAGNOSIS — S80811A Abrasion, right lower leg, initial encounter: Secondary | ICD-10-CM | POA: Insufficient documentation

## 2015-12-29 DIAGNOSIS — Y92410 Unspecified street and highway as the place of occurrence of the external cause: Secondary | ICD-10-CM | POA: Diagnosis not present

## 2015-12-29 DIAGNOSIS — S20211A Contusion of right front wall of thorax, initial encounter: Secondary | ICD-10-CM | POA: Insufficient documentation

## 2015-12-29 DIAGNOSIS — Z23 Encounter for immunization: Secondary | ICD-10-CM | POA: Insufficient documentation

## 2015-12-29 LAB — CBC
HCT: 46.3 % (ref 39.0–52.0)
Hemoglobin: 16.1 g/dL (ref 13.0–17.0)
MCH: 32 pg (ref 26.0–34.0)
MCHC: 34.8 g/dL (ref 30.0–36.0)
MCV: 92 fL (ref 78.0–100.0)
PLATELETS: 240 10*3/uL (ref 150–400)
RBC: 5.03 MIL/uL (ref 4.22–5.81)
RDW: 12.8 % (ref 11.5–15.5)
WBC: 11.5 10*3/uL — AB (ref 4.0–10.5)

## 2015-12-29 LAB — ETHANOL: Alcohol, Ethyl (B): 181 mg/dL — ABNORMAL HIGH (ref <5)

## 2015-12-29 LAB — I-STAT CHEM 8, ED
BUN: 7 mg/dL (ref 6–20)
CREATININE: 1 mg/dL (ref 0.61–1.24)
Calcium, Ion: 1.05 mmol/L — ABNORMAL LOW (ref 1.15–1.40)
Chloride: 102 mmol/L (ref 101–111)
Glucose, Bld: 83 mg/dL (ref 65–99)
HEMATOCRIT: 49 % (ref 39.0–52.0)
Hemoglobin: 16.7 g/dL (ref 13.0–17.0)
POTASSIUM: 3.6 mmol/L (ref 3.5–5.1)
Sodium: 139 mmol/L (ref 135–145)
TCO2: 19 mmol/L (ref 0–100)

## 2015-12-29 LAB — COMPREHENSIVE METABOLIC PANEL
ALK PHOS: 62 U/L (ref 38–126)
ALT: 40 U/L (ref 17–63)
AST: 34 U/L (ref 15–41)
Albumin: 4.6 g/dL (ref 3.5–5.0)
Anion gap: 15 (ref 5–15)
BILIRUBIN TOTAL: 0.6 mg/dL (ref 0.3–1.2)
BUN: 8 mg/dL (ref 6–20)
CHLORIDE: 102 mmol/L (ref 101–111)
CO2: 20 mmol/L — ABNORMAL LOW (ref 22–32)
CREATININE: 0.86 mg/dL (ref 0.61–1.24)
Calcium: 9.4 mg/dL (ref 8.9–10.3)
GFR calc Af Amer: >60 mL/min (ref >60)
Glucose, Bld: 84 mg/dL (ref 65–99)
Potassium: 3.6 mmol/L (ref 3.5–5.1)
Sodium: 137 mmol/L (ref 135–145)
TOTAL PROTEIN: 7.6 g/dL (ref 6.5–8.1)

## 2015-12-29 LAB — SAMPLE TO BLOOD BANK

## 2015-12-29 MED ORDER — TETANUS-DIPHTH-ACELL PERTUSSIS 5-2.5-18.5 LF-MCG/0.5 IM SUSP: 0.5000 mL | Freq: Once | INTRAMUSCULAR | Status: DC

## 2015-12-29 MED ORDER — TETANUS-DIPHTH-ACELL PERTUSSIS 5-2.5-18.5 LF-MCG/0.5 IM SUSP
INTRAMUSCULAR | Status: AC
  Administered 2015-12-29: 0.5 mL via INTRAMUSCULAR
  Filled 2015-12-29: qty 0.5

## 2015-12-29 MED ORDER — IBUPROFEN 400 MG PO TABS
600.0000 mg | ORAL_TABLET | Freq: Once | ORAL | Status: AC
  Administered 2015-12-29: 600 mg via ORAL
  Filled 2015-12-29: qty 1

## 2015-12-29 MED ORDER — ONDANSETRON HCL 4 MG/2ML IJ SOLN
4.0000 mg | Freq: Once | INTRAMUSCULAR | Status: AC
  Administered 2015-12-29: 4 mg via INTRAVENOUS

## 2015-12-29 MED ORDER — TETANUS-DIPHTHERIA TOXOIDS TD 5-2 LFU IM INJ: 0.5000 mL | INJECTION | Freq: Once | INTRAMUSCULAR | Status: DC

## 2015-12-29 MED ORDER — MORPHINE SULFATE (PF) 4 MG/ML IV SOLN
4.0000 mg | Freq: Once | INTRAVENOUS | Status: AC
  Administered 2015-12-29: 4 mg via INTRAVENOUS
  Filled 2015-12-29: qty 1

## 2015-12-29 MED ORDER — SODIUM CHLORIDE 0.9 % IV BOLUS (SEPSIS)
125.0000 mL | Freq: Once | INTRAVENOUS | Status: AC
  Administered 2015-12-29: 125 mL via INTRAVENOUS

## 2015-12-29 MED ORDER — ONDANSETRON HCL 4 MG/2ML IJ SOLN
INTRAMUSCULAR | Status: AC
  Filled 2015-12-29: qty 2

## 2015-12-29 MED ORDER — IBUPROFEN 600 MG PO TABS: 600.0000 mg | ORAL_TABLET | Freq: Four times a day (QID) | ORAL | 0 refills | Status: DC | PRN
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
## 2015-12-29 NOTE — ED Notes (Signed)
Pt removed C-collar.  He is aware of dangers of removing it.  PT states R sided chest pain again.

## 2015-12-29 NOTE — ED Notes (Signed)
Pt removed c collar again and was sitting up in bed, collar replaced while maintaining c-spine
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
## 2015-12-29 NOTE — ED Notes (Signed)
CSI at bedside.

## 2015-12-29 NOTE — ED Notes (Signed)
Pt removed c collar after educated not too, collar reapplied while maintaining cspine

## 2015-12-29 NOTE — Discharge Instructions (Signed)
Apply ice to areas of swelling and bruising, take medication as needed for pain follow-up with an orthopedic doctor regarding her wrist injury
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
## 2015-12-29 NOTE — ED Notes (Signed)
Vital signs stable. 

## 2016-01-14 ENCOUNTER — Encounter (HOSPITAL_BASED_OUTPATIENT_CLINIC_OR_DEPARTMENT_OTHER): Payer: Self-pay | Admitting: *Deleted

## 2016-01-14 ENCOUNTER — Emergency Department (HOSPITAL_BASED_OUTPATIENT_CLINIC_OR_DEPARTMENT_OTHER)
Admission: EM | Admit: 2016-01-14 | Discharge: 2016-01-14 | Disposition: A | Discharge status: Home / Self Care | Payer: Self-pay | Attending: Emergency Medicine | Admitting: Emergency Medicine

## 2016-01-14 DIAGNOSIS — M5416 Radiculopathy, lumbar region: Secondary | ICD-10-CM

## 2016-01-14 DIAGNOSIS — F17200 Nicotine dependence, unspecified, uncomplicated: Secondary | ICD-10-CM | POA: Insufficient documentation

## 2016-01-14 MED ORDER — DEXAMETHASONE SODIUM PHOSPHATE 10 MG/ML IJ SOLN
10.0000 mg | Freq: Once | INTRAMUSCULAR | Status: AC
  Administered 2016-01-14: 10 mg via INTRAMUSCULAR
  Filled 2016-01-14: qty 1

## 2016-01-14 MED ORDER — KETOROLAC TROMETHAMINE 60 MG/2ML IM SOLN
60.0000 mg | Freq: Once | INTRAMUSCULAR | Status: AC
  Administered 2016-01-14: 60 mg via INTRAMUSCULAR
  Filled 2016-01-14: qty 2

## 2016-01-14 MED ORDER — TRAMADOL HCL 50 MG PO TABS: 50.0000 mg | ORAL_TABLET | Freq: Four times a day (QID) | ORAL | 0 refills | Status: DC | PRN

## 2016-01-14 MED ORDER — CYCLOBENZAPRINE HCL 5 MG PO TABS: 5.0000 mg | ORAL_TABLET | Freq: Two times a day (BID) | ORAL | 0 refills | Status: DC | PRN

## 2016-01-14 MED ORDER — NAPROXEN 500 MG PO TABS: 500.0000 mg | ORAL_TABLET | Freq: Two times a day (BID) | ORAL | 0 refills | Status: DC

## 2016-01-14 NOTE — ED Triage Notes (Signed)
Lower back pain x 2 weeks. Hx of sciatica 2-3 times a year.

## 2016-01-14 NOTE — Discharge Instructions (Signed)
I am giving you the results of your CT scan. There is no specific finding that appears to be causing your pain. You may need an MRI as an outpatient. An alternative diagnosis may be Psoas Syndrome. Please read the article I have attached.  While you are awaiting your insurance you may want to see a chiropractor or an experienced massage therapist. Smoking is directly related to back problems. If you smoke you should stop immediately.  SEEK IMMEDIATE MEDICAL ATTENTION IF: New numbness, tingling, weakness, or problem with the use of your arms or legs.  Severe back pain not relieved with medications.  Change in bowel or bladder control.  Increasing pain in any areas of the body (such as chest or abdominal pain).  Shortness of breath, dizziness or fainting.  Nausea (feeling sick to your stomach), vomiting, fever, or sweats.

## 2016-01-14 NOTE — ED Provider Notes (Signed)
[facility] Provider Note   CSN: [id number] Arrival date & time: 01/14/16  1651  By signing my name below, I, Joseph Bernard, attest that this documentation has been prepared under the direction and in the presence of non-physician practitioner, Arthor CaptainAbigail Chyenne Sobczak, PA-C. Electronically Signed: Freida Busmaniana Bernard, Scribe. 01/14/2016. 5:19 PM.    History   Chief Complaint Chief Complaint  Patient presents with  . Back Pain    The history is provided by the patient. No language interpreter was used.    HPI Comments:  Joseph Bernard is a 30 y.o. male who presents to the Emergency Department complaining of worsening lower back pain x a few days (R>L). His pain is exacerbated when seated upright and occasionally shoots into his groin; no radiation of pain down his BLE. Pt has a h/o similar pain; states he has  ~ 2 episodes a year and has been diagnosed with sciatica. He is able to ambulate without assistance. Pt has been stretching with mild temporary relief. He has not followed up with orthopedist or any other specialist due to lack of insurance. Pt has initial appointment with health and wellness on 01/21/16. He denies urinary symptoms, bowel/bladder incontinence,  and weakness in his lower extremities. No acute injury or fall. He notes 14 year past history of skateboarding prior to the start of his chronic back pain a few years ago.   Past Medical History:  Diagnosis Date  . No pertinent past medical history     There are no active problems to display for this patient.   Past Surgical History:  Procedure Laterality Date  . dental extractions    . TENDON REPAIR  03/17/2012   Procedure: TENDON REPAIR;  Surgeon: Dominica SeverinWilliam Gramig, MD;  [facility];  Service: Orthopedics;  Laterality: Right;  Irrigation & Debridement and Repair Nerve and Tendon Right Wrist and Hand       Home Medications    Prior to Admission medications   Medication Sig Start Date End Date Taking?  Authorizing Provider  acetaminophen (TYLENOL) 325 MG tablet Take 650 mg by mouth every 6 (six) hours as needed for moderate pain.    Historical Provider, MD  cephALEXin (KEFLEX) 500 MG capsule Take 1 capsule (500 mg total) by mouth 4 (four) times daily. Patient not taking: Reported on 11/26/2014 03/14/12   Dominica SeverinWilliam Gramig, MD  cyclobenzaprine (FLEXERIL) 5 MG tablet Take 1 tablet (5 mg total) by mouth 3 (three) times daily. 04/15/15   Earley FavorGail Schulz, NP  ibuprofen (ADVIL,MOTRIN) 600 MG tablet Take 1 tablet (600 mg total) by mouth every 6 (six) hours as needed. 12/29/15   Linwood DibblesJon Knapp, MD  oxyCODONE-acetaminophen (ROXICET) 5-325 MG per tablet Take 2 tablets by mouth every 4 (four) hours as needed for pain. Patient not taking: Reported on 11/26/2014 03/14/12   Dominica SeverinWilliam Gramig, MD  predniSONE (DELTASONE) 20 MG tablet 3 Tabs PO Days 1-3, then 2 tabs PO Days 4-6, then 1 tab PO Day 7-9, then Half Tab PO Day 10-12 04/15/15   Earley FavorGail Schulz, NP  traMADol (ULTRAM) 50 MG tablet Take 1 tablet (50 mg total) by mouth every 6 (six) hours as needed. 04/15/15   Earley FavorGail Schulz, NP    Family History No family history on file.  Social History Social History  Substance Use Topics  . Smoking status: Current Every Day Smoker    Packs/day: 0.50  . Smokeless tobacco: Never Used  . Alcohol use Yes     Comment: occasionally  Allergies   Hydrocodone   Review of Systems Review of Systems  Constitutional: Negative for chills and fever.  Respiratory: Negative for shortness of breath.   Cardiovascular: Negative for chest pain.  Genitourinary: Negative for difficulty urinating, dysuria and hematuria.  Musculoskeletal: Positive for back pain.  Neurological: Negative for weakness and numbness.     Physical Exam Updated Vital Signs BP 136/85   Pulse 74   Temp 98.6 F (37 C) (Oral)   Resp 18   Ht 5\' 11"  (1.803 m)   Wt 170 lb (77.1 kg)   SpO2 100%   BMI 23.71 kg/m   Physical Exam  Constitutional: He is oriented to person,  place, and time. He appears well-developed and well-nourished. No distress.  HENT:  Head: Normocephalic.  Eyes: Conjunctivae are normal.  Neck: Normal range of motion. Neck supple.  Cardiovascular: Normal rate.   Pulmonary/Chest: Effort normal.  Abdominal: He exhibits no distension.  Musculoskeletal: Normal range of motion. He exhibits no tenderness.  No pain noted to back musculature   Neurological: He is alert and oriented to person, place, and time.  Skin: Skin is warm and dry.  Psychiatric: He has a normal mood and affect.  Nursing note and vitals reviewed.    ED Treatments / Results  DIAGNOSTIC STUDIES:  Oxygen Saturation is 100% on RA, normal by my interpretation.    COORDINATION OF CARE:  5:18 PM Discussed treatment plan with pt at bedside and pt agreed to plan.  Labs (all labs ordered are listed, but only abnormal results are displayed) Labs Reviewed - No data to display  EKG  EKG Interpretation None       Radiology No results found.  Procedures Procedures (including critical care time)  Medications Ordered in ED Medications - No data to display   Initial Impression / Assessment and Plan / ED Course  I have reviewed the triage vital signs and the nursing notes.  Pertinent labs & imaging results that were available during my care of the patient were reviewed by me and considered in my medical decision making (see chart for details).  Clinical Course       Patient with back pain.  No neurological deficits and normal neuro exam.  Patient is ambulatory.  No loss of bowel or bladder control.  No concern for cauda equina.  No fever, night sweats, weight loss, h/o cancer, IVDA, no recent procedure to back. No urinary symptoms suggestive of UTI.  Supportive care and return precaution discussed. Appears safe for discharge at this time. Follow up as indicated in discharge paperwork.   Final Clinical Impressions(s) / ED Diagnoses   Final diagnoses:  Lumbar  back pain with radiculopathy affecting left lower extremity  Lumbar back pain with radiculopathy affecting right lower extremity    New Prescriptions New Prescriptions   No medications on file  I personally performed the services described in this documentation, which was scribed in my presence. The recorded information has been reviewed and is accurate.       Arthor CaptainAbigail Amneet Cendejas, PA-C 01/15/16 0023    Melene Planan Floyd, DO 01/15/16 1621

## 2016-11-04 ENCOUNTER — Emergency Department (HOSPITAL_COMMUNITY)
Admission: EM | Admit: 2016-11-04 | Discharge: 2016-11-05 | Disposition: A | Discharge status: Home / Self Care | Payer: Self-pay | Attending: Emergency Medicine | Admitting: Emergency Medicine

## 2016-11-04 ENCOUNTER — Encounter (HOSPITAL_COMMUNITY): Payer: Self-pay

## 2016-11-04 DIAGNOSIS — Y9384 Activity, sleeping: Secondary | ICD-10-CM | POA: Insufficient documentation

## 2016-11-04 DIAGNOSIS — S0502XA Injury of conjunctiva and corneal abrasion without foreign body, left eye, initial encounter: Secondary | ICD-10-CM | POA: Insufficient documentation

## 2016-11-04 DIAGNOSIS — Z79899 Other long term (current) drug therapy: Secondary | ICD-10-CM | POA: Insufficient documentation

## 2016-11-04 DIAGNOSIS — Y92003 Bedroom of unspecified non-institutional (private) residence as the place of occurrence of the external cause: Secondary | ICD-10-CM | POA: Insufficient documentation

## 2016-11-04 DIAGNOSIS — Y999 Unspecified external cause status: Secondary | ICD-10-CM | POA: Insufficient documentation

## 2016-11-04 DIAGNOSIS — F17200 Nicotine dependence, unspecified, uncomplicated: Secondary | ICD-10-CM | POA: Insufficient documentation

## 2016-11-04 DIAGNOSIS — X58XXXA Exposure to other specified factors, initial encounter: Secondary | ICD-10-CM | POA: Insufficient documentation

## 2016-11-04 MED ORDER — TETRACAINE HCL 0.5 % OP SOLN
2.0000 [drp] | Freq: Once | OPHTHALMIC | Status: AC
  Administered 2016-11-04: 2 [drp] via OPHTHALMIC
  Filled 2016-11-04: qty 4

## 2016-11-04 MED ORDER — FLUORESCEIN SODIUM 0.6 MG OP STRP
1.0000 | ORAL_STRIP | Freq: Once | OPHTHALMIC | Status: AC
  Administered 2016-11-04: 1 via OPHTHALMIC
  Filled 2016-11-04: qty 1

## 2016-11-04 NOTE — ED Triage Notes (Signed)
States woke up this am with something in his left eye and now still painful and watering with some blurred vision voiced.

## 2016-11-05 MED ORDER — KETOROLAC TROMETHAMINE 0.5 % OP SOLN
1.0000 [drp] | Freq: Four times a day (QID) | OPHTHALMIC | Status: DC | PRN
  Administered 2016-11-05: 1 [drp] via OPHTHALMIC
  Filled 2016-11-05: qty 5

## 2016-11-05 MED ORDER — GATIFLOXACIN 0.5 % OP SOLN
1.0000 [drp] | Freq: Four times a day (QID) | OPHTHALMIC | Status: DC
  Administered 2016-11-05: 1 [drp] via OPHTHALMIC
  Filled 2016-11-05: qty 2.5

## 2016-11-05 MED ORDER — CYCLOPENTOLATE HCL 1 % OP SOLN
2.0000 [drp] | Freq: Once | OPHTHALMIC | Status: AC
  Administered 2016-11-05: 2 [drp] via OPHTHALMIC
  Filled 2016-11-05: qty 2

## 2016-11-05 NOTE — ED Provider Notes (Signed)
[facility]-EMERGENCY DEPT Provider Note   CSN: [id number] Arrival date & time: 11/04/16  2211     History   Chief Complaint Chief Complaint  Patient presents with  . Eye Pain    HPI Joseph Bernard is a 31 y.o. male.  Patient presents to the emergency department with a sensation that there is something in his left eye. Patient reports that he woke up this morning and felt like there was something in his eye and it has persisted all day. He has irritation, redness, drainage and blurred vision in the left eye. He is unaware of any direct trauma.      Past Medical History:  Diagnosis Date  . No pertinent past medical history     There are no active problems to display for this patient.   Past Surgical History:  Procedure Laterality Date  . dental extractions    . TENDON REPAIR  03/17/2012   Procedure: TENDON REPAIR;  Surgeon: Dominica SeverinWilliam Gramig, MD;  [facility];  Service: Orthopedics;  Laterality: Right;  Irrigation & Debridement and Repair Nerve and Tendon Right Wrist and Hand       Home Medications    Prior to Admission medications   Medication Sig Start Date End Date Taking? Authorizing Provider  acetaminophen (TYLENOL) 325 MG tablet Take 650 mg by mouth every 6 (six) hours as needed for moderate pain.    [provider]  cephALEXin (KEFLEX) 500 MG capsule Take 1 capsule (500 mg total) by mouth 4 (four) times daily. Patient not taking: Reported on 11/26/2014 03/14/12   Dominica SeverinGramig, William, MD  cyclobenzaprine (FLEXERIL) 5 MG tablet Take 1 tablet (5 mg total) by mouth 2 (two) times daily as needed for muscle spasms. 01/14/16   Harris, Cammy CopaAbigail, PA-C  ibuprofen (ADVIL,MOTRIN) 600 MG tablet Take 1 tablet (600 mg total) by mouth every 6 (six) hours as needed. 12/29/15   Linwood DibblesKnapp, Jon, MD  naproxen (NAPROSYN) 500 MG tablet Take 1 tablet (500 mg total) by mouth 2 (two) times daily with a meal. 01/14/16   Arthor CaptainHarris, Abigail, PA-C  oxyCODONE-acetaminophen (ROXICET) 5-325 MG per  tablet Take 2 tablets by mouth every 4 (four) hours as needed for pain. Patient not taking: Reported on 11/26/2014 03/14/12   Dominica SeverinGramig, William, MD  predniSONE (DELTASONE) 20 MG tablet 3 Tabs PO Days 1-3, then 2 tabs PO Days 4-6, then 1 tab PO Day 7-9, then Half Tab PO Day 10-12 04/15/15   Earley FavorSchulz, Gail, NP  traMADol (ULTRAM) 50 MG tablet Take 1 tablet (50 mg total) by mouth every 6 (six) hours as needed. 01/14/16   Arthor CaptainHarris, Abigail, PA-C    Family History History reviewed. No pertinent family history.  Social History Social History  Substance Use Topics  . Smoking status: Current Every Day Smoker    Packs/day: 0.50  . Smokeless tobacco: Never Used  . Alcohol use Yes     Comment: occasionally     Allergies   Hydrocodone   Review of Systems Review of Systems  Eyes: Positive for pain.     Physical Exam Updated Vital Signs BP (!) 152/100 (BP Location: Left Arm)   Pulse (!) 123   Temp 98.3 F (36.8 C) (Oral)   Resp 20   SpO2 99%   Physical Exam  Constitutional: He is oriented to person, place, and time. He appears well-developed.  HENT:  Head: Normocephalic.  Eyes: Pupils are equal, round, and reactive to light. EOM are normal. Left eye exhibits chemosis. No foreign body  present in the left eye. Left conjunctiva is injected. Left conjunctiva has no hemorrhage.  Slit lamp exam:      The left eye shows corneal abrasion and fluorescein uptake. The left eye shows no corneal flare, no corneal ulcer, no foreign body, no hyphema and no hypopyon.    Vertically oriented linear abrasion over her central pupil  Pulmonary/Chest: Effort normal.  Musculoskeletal: Normal range of motion.  Neurological: He is alert and oriented to person, place, and time.  Skin: Skin is warm and dry.     ED Treatments / Results  Labs (all labs ordered are listed, but only abnormal results are displayed) Labs Reviewed - No data to display  EKG  EKG Interpretation None       Radiology No results  found.  Procedures Procedures (including critical care time)  Medications Ordered in ED Medications  cyclopentolate (CYCLODRYL,CYCLOGYL) 1 % ophthalmic solution 2 drop (not administered)  ketorolac (ACULAR) 0.5 % ophthalmic solution 1 drop (not administered)  gatifloxacin (ZYMAXID) 0.5 % ophthalmic drops 1 drop (not administered)  tetracaine (PONTOCAINE) 0.5 % ophthalmic solution 2 drop (2 drops Left Eye Given 11/04/16 2353)  fluorescein ophthalmic strip 1 strip (1 strip Left Eye Given 11/04/16 2353)     Initial Impression / Assessment and Plan / ED Course  I have reviewed the triage vital signs and the nursing notes.  Pertinent labs & imaging results that were available during my care of the patient were reviewed by me and considered in my medical decision making (see chart for details).     Patient presents with foreign body sensation. Careful examination including slit-lamp examination with limits aorta does not reveal any retained foreign body. Patient does have a corneal abrasion which explains his body sensation.  Final Clinical Impressions(s) / ED Diagnoses   Final diagnoses:  Abrasion of left cornea, initial encounter    New Prescriptions New Prescriptions   No medications on file     Gilda Crease, MD 11/05/16 [phone]

## 2016-11-05 NOTE — ED Notes (Signed)
Pt ambulatory and independent at discharge.  Verbalized understanding of discharge instructions 

## 2017-02-23 ENCOUNTER — Emergency Department (HOSPITAL_COMMUNITY): Payer: Self-pay

## 2017-02-23 ENCOUNTER — Emergency Department (HOSPITAL_COMMUNITY)
Admission: EM | Admit: 2017-02-23 | Discharge: 2017-02-24 | Disposition: A | Discharge status: Home / Self Care | Payer: Self-pay | Attending: Emergency Medicine | Admitting: Emergency Medicine

## 2017-02-23 ENCOUNTER — Encounter (HOSPITAL_COMMUNITY): Payer: Self-pay | Admitting: Family Medicine

## 2017-02-23 DIAGNOSIS — K5732 Diverticulitis of large intestine without perforation or abscess without bleeding: Secondary | ICD-10-CM | POA: Insufficient documentation

## 2017-02-23 DIAGNOSIS — R1032 Left lower quadrant pain: Secondary | ICD-10-CM

## 2017-02-23 DIAGNOSIS — F17210 Nicotine dependence, cigarettes, uncomplicated: Secondary | ICD-10-CM | POA: Insufficient documentation

## 2017-02-23 DIAGNOSIS — K5900 Constipation, unspecified: Secondary | ICD-10-CM | POA: Insufficient documentation

## 2017-02-23 LAB — CBC WITH DIFFERENTIAL/PLATELET
BASOS ABS: 0.1 10*3/uL (ref 0.0–0.1)
BASOS PCT: 1 %
Eosinophils Absolute: 0.3 10*3/uL (ref 0.0–0.7)
Eosinophils Relative: 2 %
HEMATOCRIT: 42.4 % (ref 39.0–52.0)
HEMOGLOBIN: 14.6 g/dL (ref 13.0–17.0)
Lymphocytes Relative: 28 %
Lymphs Abs: 3.3 10*3/uL (ref 0.7–4.0)
MCH: 32 pg (ref 26.0–34.0)
MCHC: 34.4 g/dL (ref 30.0–36.0)
MCV: 93 fL (ref 78.0–100.0)
MONOS PCT: 9 %
Monocytes Absolute: 1 10*3/uL (ref 0.1–1.0)
NEUTROS ABS: 7.2 10*3/uL (ref 1.7–7.7)
NEUTROS PCT: 60 %
Platelets: 264 10*3/uL (ref 150–400)
RBC: 4.56 MIL/uL (ref 4.22–5.81)
RDW: 12.9 % (ref 11.5–15.5)
WBC: 11.8 10*3/uL — ABNORMAL HIGH (ref 4.0–10.5)

## 2017-02-23 LAB — I-STAT CHEM 8, ED
BUN: 8 mg/dL (ref 6–20)
CREATININE: 0.8 mg/dL (ref 0.61–1.24)
Calcium, Ion: 1.22 mmol/L (ref 1.15–1.40)
Chloride: 103 mmol/L (ref 101–111)
Glucose, Bld: 103 mg/dL — ABNORMAL HIGH (ref 65–99)
HEMATOCRIT: 45 % (ref 39.0–52.0)
Hemoglobin: 15.3 g/dL (ref 13.0–17.0)
Potassium: 3.4 mmol/L — ABNORMAL LOW (ref 3.5–5.1)
SODIUM: 142 mmol/L (ref 135–145)
TCO2: 22 mmol/L (ref 22–32)

## 2017-02-23 LAB — COMPREHENSIVE METABOLIC PANEL WITH GFR
ALT: 25 U/L (ref 17–63)
AST: 19 U/L (ref 15–41)
Albumin: 4.2 g/dL (ref 3.5–5.0)
Alkaline Phosphatase: 76 U/L (ref 38–126)
Anion gap: 10 (ref 5–15)
BUN: 10 mg/dL (ref 6–20)
CO2: 22 mmol/L (ref 22–32)
Calcium: 9.8 mg/dL (ref 8.9–10.3)
Chloride: 109 mmol/L (ref 101–111)
Creatinine, Ser: 0.79 mg/dL (ref 0.61–1.24)
GFR calc Af Amer: >60 mL/min
GFR calc non Af Amer: >60 mL/min
Glucose, Bld: 105 mg/dL — ABNORMAL HIGH (ref 65–99)
Potassium: 3.4 mmol/L — ABNORMAL LOW (ref 3.5–5.1)
Sodium: 141 mmol/L (ref 135–145)
Total Bilirubin: 0.5 mg/dL (ref 0.3–1.2)
Total Protein: 7.8 g/dL (ref 6.5–8.1)

## 2017-02-23 LAB — LIPASE, BLOOD: Lipase: 32 U/L (ref 11–51)

## 2017-02-23 MED ORDER — MORPHINE SULFATE (PF) 4 MG/ML IV SOLN
4.0000 mg | Freq: Once | INTRAVENOUS | Status: AC
  Administered 2017-02-24: 4 mg via INTRAVENOUS
  Filled 2017-02-23: qty 1

## 2017-02-23 MED ORDER — ONDANSETRON HCL 4 MG/2ML IJ SOLN
4.0000 mg | Freq: Once | INTRAMUSCULAR | Status: AC
  Administered 2017-02-24: 4 mg via INTRAVENOUS
  Filled 2017-02-23: qty 2

## 2017-02-23 MED ORDER — CIPROFLOXACIN HCL 500 MG PO TABS
500.0000 mg | ORAL_TABLET | Freq: Once | ORAL | Status: AC
  Administered 2017-02-24: 500 mg via ORAL
  Filled 2017-02-23: qty 1

## 2017-02-23 MED ORDER — METRONIDAZOLE 500 MG PO TABS
500.0000 mg | ORAL_TABLET | Freq: Once | ORAL | Status: AC
  Administered 2017-02-24: 500 mg via ORAL
  Filled 2017-02-23: qty 1

## 2017-02-23 MED ORDER — IOPAMIDOL (ISOVUE-300) INJECTION 61%
INTRAVENOUS | Status: AC
  Administered 2017-02-23: 100 mL via INTRAVENOUS
  Filled 2017-02-23: qty 100

## 2017-02-23 MED ORDER — SODIUM CHLORIDE 0.9 % IV BOLUS (SEPSIS)
1000.0000 mL | Freq: Once | INTRAVENOUS | Status: AC
  Administered 2017-02-23: 1000 mL via INTRAVENOUS

## 2017-02-23 NOTE — ED Triage Notes (Signed)
Patient complains of left lower abd pain and relates his pain to constipation. His last bowel movement was 1 week ago. Patient reports he has been having trouble slowly over the last two months. Patient has been using laxatives. Denies fever. Reports nausea. When calling patient from the lobby, he was eating food with complications.

## 2017-02-24 MED ORDER — CIPROFLOXACIN HCL 500 MG PO TABS: 500.0000 mg | ORAL_TABLET | Freq: Two times a day (BID) | ORAL | 0 refills | Status: AC

## 2017-02-24 MED ORDER — MORPHINE SULFATE (PF) 2 MG/ML IV SOLN
2.0000 mg | Freq: Once | INTRAVENOUS | Status: AC
Start: 2017-02-24 — End: 2017-02-24
  Administered 2017-02-24: 2 mg via INTRAVENOUS
  Filled 2017-02-24: qty 1

## 2017-02-24 MED ORDER — DICYCLOMINE HCL 10 MG/ML IM SOLN
20.0000 mg | Freq: Once | INTRAMUSCULAR | Status: AC
  Administered 2017-02-24: 20 mg via INTRAMUSCULAR
  Filled 2017-02-24: qty 2

## 2017-02-24 MED ORDER — DICYCLOMINE HCL 10 MG/ML IM SOLN
20.0000 mg | Freq: Once | INTRAMUSCULAR | Status: DC
  Filled 2017-02-24: qty 2

## 2017-02-24 MED ORDER — METRONIDAZOLE 500 MG PO TABS: 500.0000 mg | ORAL_TABLET | Freq: Three times a day (TID) | ORAL | 0 refills | Status: AC

## 2017-02-24 MED ORDER — DICYCLOMINE HCL 20 MG PO TABS: 20.0000 mg | ORAL_TABLET | Freq: Two times a day (BID) | ORAL | 0 refills | Status: DC

## 2017-02-24 MED ORDER — DOCUSATE SODIUM 250 MG PO CAPS: 250.0000 mg | ORAL_CAPSULE | Freq: Every day | ORAL | 0 refills | Status: AC

## 2017-02-24 MED ORDER — ONDANSETRON 4 MG PO TBDP: ORAL_TABLET | ORAL | 0 refills | Status: DC

## 2017-02-24 NOTE — ED Provider Notes (Signed)
[facility]-EMERGENCY DEPT Provider Note   CSN: [id number] Arrival date & time: 02/23/17  1641     History   Chief Complaint Chief Complaint  Patient presents with  . Constipation    HPI   HPI   Joseph Bernard is a 31 y.o. Male with no pertinent past medical history, presents complaining of left lower abdominal pain and 1 week of constipation.  Patient reports for the past 2 months he has had trouble with constipation, not having bowel movements as frequently as he did before, and reports over the past week it has gotten worse and this is the first time he is experienced abdominal pain.  He localizes pain to the left lower quadrant reports it is constant, with intermittent sharp pains.  Patient reports some mild nausea today, but no vomiting.  Reports some chills but no noted fevers.  He denies any blood in his stools or dark tarry stools.  He has continued to eat food without issues.  Patient denies any urinary symptoms, no hematuria or flank pain, no history of kidney stones.    Past Medical History:  Diagnosis Date  . No pertinent past medical history     There are no active problems to display for this patient.   Past Surgical History:  Procedure Laterality Date  . dental extractions    . TENDON REPAIR  03/17/2012   Procedure: TENDON REPAIR;  Surgeon: Dominica SeverinWilliam Gramig, MD;  [facility];  Service: Orthopedics;  Laterality: Right;  Irrigation & Debridement and Repair Nerve and Tendon Right Wrist and Hand       Home Medications    Prior to Admission medications   Medication Sig Start Date End Date Taking? Authorizing Provider  acetaminophen (TYLENOL) 325 MG tablet Take 650 mg by mouth every 6 (six) hours as needed for moderate pain.   Yes [provider]  amphetamine-dextroamphetamine (ADDERALL XR) 30 MG 24 hr capsule Take 30 mg by mouth daily.   Yes [provider]    Family History History reviewed. No pertinent family  history.  Social History Social History   Tobacco Use  . Smoking status: Current Every Day Smoker    Packs/day: 0.50  . Smokeless tobacco: Never Used  Substance Use Topics  . Alcohol use: No    Frequency: Never  . Drug use: No     Allergies   Hydrocodone   Review of Systems Review of Systems   Physical Exam Updated Vital Signs BP 123/90   Pulse 93   Temp 98.7 F (37.1 C) (Oral)   Resp 18   Ht 5\' 11"  (1.803 m)   Wt 79.4 kg (175 lb)   SpO2 97%   BMI 24.41 kg/m   Physical Exam  Constitutional: He is oriented to person, place, and time. He appears well-developed and well-nourished. No distress.  HENT:  Head: Normocephalic and atraumatic.  Eyes: Right eye exhibits no discharge. Left eye exhibits no discharge.  Cardiovascular: Normal rate, regular rhythm and normal heart sounds.  Pulmonary/Chest: Effort normal and breath sounds normal. No stridor. No respiratory distress. He has no wheezes. He has no rales.  Abdominal: Soft. Bowel sounds are normal. He exhibits no mass. There is tenderness. There is guarding.  Diffuse abdominal tenderness, with focal severe tenderness in the left lower quadrant, with guarding, no tenderness at McBurney's point, negative Murphy sign, no CVA tenderness  Musculoskeletal: He exhibits no edema or deformity.  Neurological: He is alert and oriented to person, place,  and time. Coordination normal.  Skin: Skin is warm and dry. Capillary refill takes less than 2 seconds. He is not diaphoretic.  Psychiatric: He has a normal mood and affect. His behavior is normal.  Nursing note and vitals reviewed.    ED Treatments / Results  Labs (all labs ordered are listed, but only abnormal results are displayed) Labs Reviewed  CBC WITH DIFFERENTIAL/PLATELET - Abnormal; Notable for the following components:      Result Value   WBC 11.8 (*)    All other components within normal limits  COMPREHENSIVE METABOLIC PANEL - Abnormal; Notable for the following  components:   Potassium 3.4 (*)    Glucose, Bld 105 (*)    All other components within normal limits  I-STAT CHEM 8, ED - Abnormal; Notable for the following components:   Potassium 3.4 (*)    Glucose, Bld 103 (*)    All other components within normal limits  LIPASE, BLOOD  URINALYSIS, ROUTINE W REFLEX MICROSCOPIC    EKG  EKG Interpretation None       Radiology Dg Abdomen 1 View  Result Date: 02/23/2017 CLINICAL DATA:  Constipation for 1 week.  Left lower abdominal pain EXAM: ABDOMEN - 1 VIEW COMPARISON:  Abdominal CT 12/29/2015 FINDINGS: Normal bowel gas pattern. Stool seen in the ascending colon primarily. No generalized stool retention. No concerning mass effect or gas collection. There is moderate gaseous distention of the stomach. No significant osseous findings. IMPRESSION: Normal bowel gas pattern.  No generalized stool retention. Electronically Signed   By: Marnee Spring M.D.   On: 02/23/2017 21:51   Ct Abdomen Pelvis W Contrast  Result Date: 02/23/2017 CLINICAL DATA:  Left lower quadrant pain for 3 days EXAM: CT ABDOMEN AND PELVIS WITH CONTRAST TECHNIQUE: Multidetector CT imaging of the abdomen and pelvis was performed using the standard protocol following bolus administration of intravenous contrast. CONTRAST:  ISOVUE-300 IOPAMIDOL (ISOVUE-300) INJECTION 61% COMPARISON:  December 29, 2015 FINDINGS: Lower chest: There is a small bulla in the right lower lobe. There is no lung base edema or consolidation. Hepatobiliary: No focal liver lesions are appreciable. Gallbladder is mildly contracted without appreciable gallbladder wall thickening. There is no biliary duct dilatation. Pancreas: No pancreatic mass or inflammatory focus. Spleen: No splenic lesions are evident. Adrenals/Urinary Tract: Adrenals appear normal bilaterally. Kidneys bilaterally show no evident mass or hydronephrosis on either side. There is no renal or ureteral calculus on either side. Urinary bladder is  midline with wall thickness within normal limits. Stomach/Bowel: There is thickening of the wall of the proximal to mid sigmoid colon with adjacent mesenteric stranding consistent with focal diverticulitis. No perforation or abscess is seen in this area. Elsewhere, there is no appreciable bowel wall or mesenteric thickening. No evident bowel obstruction. No free air or portal venous air. Vascular/Lymphatic: There is no abdominal aortic aneurysm. No vascular lesions are evident. There is no appreciable adenopathy in the abdomen or pelvis. Reproductive: Prostate and seminal vesicles appear normal in size and contour. No evident pelvic mass. Other: Appendix appears normal. There is no abscess or ascites in the abdomen or pelvis. Musculoskeletal: There are no blastic or lytic bone lesions. There is no intramuscular or abdominal wall lesion. IMPRESSION: 1. Sigmoid diverticulitis. No diverticular abscess or microperforation evident. 2. No bowel obstruction. No abscess elsewhere in the abdomen or pelvis. Appendix appears normal. 3.  No renal or ureteral calculus.  No hydronephrosis. Electronically Signed   By: Bretta Bang III M.D.   On: 02/23/2017  23:12    Procedures Procedures (including critical care time)  Medications Ordered in ED Medications  morphine 2 MG/ML injection 2 mg (not administered)  dicyclomine (BENTYL) injection 20 mg (not administered)  sodium chloride 0.9 % bolus 1,000 mL (1,000 mLs Intravenous New Bag/Given 02/23/17 2322)  ondansetron (ZOFRAN) injection 4 mg (4 mg Intravenous Given 02/24/17 0012)  morphine 4 MG/ML injection 4 mg (4 mg Intravenous Given 02/24/17 0011)  iopamidol (ISOVUE-300) 61 % injection (100 mLs Intravenous Contrast Given 02/23/17 2257)  ciprofloxacin (CIPRO) tablet 500 mg (500 mg Oral Given 02/24/17 0012)  metroNIDAZOLE (FLAGYL) tablet 500 mg (500 mg Oral Given 02/24/17 0012)     Initial Impression / Assessment and Plan / ED Course  I have reviewed the  triage vital signs and the nursing notes.  Pertinent labs & imaging results that were available during my care of the patient were reviewed by me and considered in my medical decision making (see chart for details).  Patient presents with 1 week of constipation, with left lower quadrant abdominal pain.  Mild nausea today, no episodes of vomiting.  Chills but no measured fevers.  On exam patient appears uncomfortable, but vitals otherwise normal.  On exam patient has mild diffuse tenderness, with acutely worsened tenderness in the left lower quadrant with some guarding.  KUB without obstructive pattern, or free air, stool burden primarily in the ascending colon, left lower quadrant pain unlikely due solely to constipation.  No urinary symptoms or flank tenderness, pain concerning for diverticulitis, will get abdominal labs and CT abdomen pelvis.  IV fluid bolus, morphine and zofran  Mild leukocytosis of 11.8, labs otherwise unremarkable, lipase normal, liver and kidney function normal.  CT abdomen pelvis shows sigmoid diverticulitis without perforation or abscess, no other acute intra-abdominal findings.  Uncomplicated diverticulitis can be treated with PO Cipro and Flagyl.  Will reevaluate patient after fluids and pain medication, and give p.o. Trial.  Patient reports improvement in abdominal pain, but still having some spastic pains, improved nausea, and patient able to keep down multiple cups of water.  First doses of antibiotics given in the ED. we will give 2 mg of additional morphine and Bentyl to achieve better pain control.  Patient is stable for discharge home with outpatient management of diverticulitis, with Cipro, metronidazole, stool softeners and bowel regimen, Tylenol, and Bentyl for pain.  Strict return precautions discussed.  Patient to follow-up with his PCP later this week, as well as equal GI.  Patient expressed understanding and is in agreement with plan.  Patient discussed with Dr.  Rhunette CroftNanavati, who saw patient as well and agrees with plan.    Final Clinical Impressions(s) / ED Diagnoses   Final diagnoses:  Constipation, unspecified constipation type  Diverticulitis large intestine w/o perforation or abscess w/o bleeding  Left lower quadrant pain    ED Discharge Orders    None       Dartha LodgeFord, Jolynda Townley N, [place] JerseyPA-C 02/24/17 [id number]    Derwood KaplanNanavati, Ankit, MD 02/24/17 1527

## 2017-02-24 NOTE — Discharge Instructions (Signed)
You have diverticulitis. Please take complete course of antibiotics, even if symptoms are completely resolved. You may use tylenol or bentyl for pain, and zofran for nausea. Use docusate to soften stools. Take miralax daily. It's a tasteless powder. Dissolve one capful of powder into any liquid or soft food and take once daily. If no good bowel movement in 2-3 days, take it twice daily. If no BM in 2-3 days, take three times daily and then four times daily as needed. If you're still not having a BM please use magnesium citrate. Follow up with your PCP later this week, and schedule appointment for follow-up with GI, return to ED if the below scenarios develop.  Get help right away if: Your pain gets worse, or is not improving Your symptoms progress very quickly You have a fever. You throw up (vomit) more than one time. You have poop that is: Bloody. Black. Tarry.

## 2017-04-14 ENCOUNTER — Other Ambulatory Visit: Payer: Self-pay

## 2017-04-14 ENCOUNTER — Encounter (HOSPITAL_COMMUNITY): Payer: Self-pay | Admitting: Emergency Medicine

## 2017-04-14 DIAGNOSIS — Z5321 Procedure and treatment not carried out due to patient leaving prior to being seen by health care provider: Secondary | ICD-10-CM | POA: Insufficient documentation

## 2017-04-14 LAB — CBC
HEMATOCRIT: 44.7 % (ref 39.0–52.0)
Hemoglobin: 15.5 g/dL (ref 13.0–17.0)
MCH: 32.1 pg (ref 26.0–34.0)
MCHC: 34.7 g/dL (ref 30.0–36.0)
MCV: 92.5 fL (ref 78.0–100.0)
Platelets: 275 10*3/uL (ref 150–400)
RBC: 4.83 MIL/uL (ref 4.22–5.81)
RDW: 13.6 % (ref 11.5–15.5)
WBC: 13.4 10*3/uL — ABNORMAL HIGH (ref 4.0–10.5)

## 2017-04-14 LAB — CBG MONITORING, ED: Glucose-Capillary: 104 mg/dL — ABNORMAL HIGH (ref 65–99)

## 2017-04-14 NOTE — ED Triage Notes (Signed)
Patient complaining of dizziness that started around 8 pm today. Patient also complaining of abscess in his mouth. Patient has had abscess in mouth for over a week.

## 2017-04-15 ENCOUNTER — Encounter (HOSPITAL_COMMUNITY): Payer: Self-pay | Admitting: Emergency Medicine

## 2017-04-15 ENCOUNTER — Other Ambulatory Visit: Payer: Self-pay

## 2017-04-15 ENCOUNTER — Emergency Department (HOSPITAL_COMMUNITY)
Admission: EM | Admit: 2017-04-15 | Discharge: 2017-04-15 | Disposition: A | Discharge status: Home / Self Care | Payer: Self-pay | Attending: Emergency Medicine | Admitting: Emergency Medicine

## 2017-04-15 ENCOUNTER — Emergency Department (HOSPITAL_COMMUNITY)
Admission: EM | Admit: 2017-04-15 | Discharge: 2017-04-15 | Discharge status: Against Medical Advice | Payer: Self-pay | Attending: Emergency Medicine | Admitting: Emergency Medicine

## 2017-04-15 DIAGNOSIS — Z79899 Other long term (current) drug therapy: Secondary | ICD-10-CM | POA: Insufficient documentation

## 2017-04-15 DIAGNOSIS — R42 Dizziness and giddiness: Secondary | ICD-10-CM | POA: Insufficient documentation

## 2017-04-15 DIAGNOSIS — K047 Periapical abscess without sinus: Secondary | ICD-10-CM | POA: Insufficient documentation

## 2017-04-15 DIAGNOSIS — Z720 Tobacco use: Secondary | ICD-10-CM

## 2017-04-15 DIAGNOSIS — F17210 Nicotine dependence, cigarettes, uncomplicated: Secondary | ICD-10-CM | POA: Insufficient documentation

## 2017-04-15 DIAGNOSIS — K0889 Other specified disorders of teeth and supporting structures: Secondary | ICD-10-CM

## 2017-04-15 DIAGNOSIS — K029 Dental caries, unspecified: Secondary | ICD-10-CM | POA: Insufficient documentation

## 2017-04-15 DIAGNOSIS — R6883 Chills (without fever): Secondary | ICD-10-CM | POA: Insufficient documentation

## 2017-04-15 LAB — BASIC METABOLIC PANEL
ANION GAP: 12 (ref 5–15)
BUN: 15 mg/dL (ref 6–20)
CO2: 20 mmol/L — ABNORMAL LOW (ref 22–32)
Calcium: 9.7 mg/dL (ref 8.9–10.3)
Chloride: 105 mmol/L (ref 101–111)
Creatinine, Ser: 0.72 mg/dL (ref 0.61–1.24)
GFR calc non Af Amer: >60 mL/min (ref >60)
GLUCOSE: 89 mg/dL (ref 65–99)
POTASSIUM: 3.8 mmol/L (ref 3.5–5.1)
Sodium: 137 mmol/L (ref 135–145)

## 2017-04-15 MED ORDER — NAPROXEN 500 MG PO TABS: 500.0000 mg | ORAL_TABLET | Freq: Two times a day (BID) | ORAL | 0 refills | Status: DC | PRN

## 2017-04-15 MED ORDER — PENICILLIN V POTASSIUM 500 MG PO TABS: 1000.0000 mg | ORAL_TABLET | Freq: Two times a day (BID) | ORAL | 0 refills | Status: DC

## 2017-04-15 MED ORDER — OXYCODONE-ACETAMINOPHEN 5-325 MG PO TABS: 1.0000 | ORAL_TABLET | Freq: Four times a day (QID) | ORAL | 0 refills | Status: DC | PRN

## 2017-04-15 NOTE — ED Notes (Signed)
Patient called and no answer. 

## 2017-04-15 NOTE — ED Notes (Signed)
Patient  Called and no answer.

## 2017-04-15 NOTE — ED Provider Notes (Signed)
[facility]-EMERGENCY DEPT Provider Note   CSN: [id number] Arrival date & time: 04/15/17  1930     History   Chief Complaint Chief Complaint  Patient presents with  . Dental Pain    HPI Joseph Bernard is a 32 y.o. otherwise healthy male, who presents to the ED with complaints of left lower dental and jaw pain x 2 weeks.  Patient states that he has had "a bad tooth" for a while but it has never caused any problems until 2 weeks ago when it started hurting, he placed a temporary filling in it thinking that that would help, but the pain gradually worsened and he developed gum swelling adjacent to the tooth.  He reports that the swelling has gradually worsened and the pain has become more severe.  He describes the pain as 10/10 constant stabbing left lower jaw/dental pain that radiates along the entire left side of his face and neck, worse with talking and eating, and unrelieved with Aleve, Advil, and salt water swishes.  He reports associated chills, gum swelling, and trismus due to the pain.  He admits to being a cigarette smoker.  He does not currently have a dentist.  Of note, he was seen yesterday for lightheadedness when he stood up as well as for these symptoms, had a CBC which showed a white blood cell count of 13.4 and a BMP which was essentially within normal limits.  He did not stay for evaluation because of the wait time.  He denies any gum drainage, ear pain or drainage, drooling, fevers, chills, CP, SOB, abd pain, N/V/D/C, hematuria, dysuria, myalgias, arthralgias, numbness, tingling, focal weakness, or any other complaints at this time.    The history is provided by the patient and medical records. No language interpreter was used.  Dental Pain   This is a new problem. The current episode started more than 1 week ago. The problem occurs constantly. The problem has been gradually worsening. The pain is at a severity of 10/10. The pain is severe. He has tried  acetaminophen (aleve, advil, and salt water) for the symptoms. The treatment provided no relief.    Past Medical History:  Diagnosis Date  . No pertinent past medical history     There are no active problems to display for this patient.   Past Surgical History:  Procedure Laterality Date  . dental extractions    . TENDON REPAIR  03/17/2012   Procedure: TENDON REPAIR;  Surgeon: Dominica SeverinWilliam Gramig, MD;  [facility];  Service: Orthopedics;  Laterality: Right;  Irrigation & Debridement and Repair Nerve and Tendon Right Wrist and Hand       Home Medications    Prior to Admission medications   Medication Sig Start Date End Date Taking? Authorizing Provider  acetaminophen (TYLENOL) 325 MG tablet Take 650 mg by mouth every 6 (six) hours as needed for moderate pain.    [provider]  amphetamine-dextroamphetamine (ADDERALL XR) 30 MG 24 hr capsule Take 30 mg by mouth daily.    [provider]  dicyclomine (BENTYL) 20 MG tablet Take 1 tablet (20 mg total) by mouth 2 (two) times daily. 02/24/17   Dartha LodgeFord, Kelsey N, PA-C  docusate sodium (COLACE) 250 MG capsule Take 1 capsule (250 mg total) by mouth daily. 02/24/17   Dartha LodgeFord, Kelsey N, PA-C  ondansetron (ZOFRAN ODT) 4 MG disintegrating tablet 4mg  ODT q4 hours prn nausea/vomit 02/24/17   Dartha LodgeFord, Kelsey N, PA-C    Family History Family  History  Problem Relation Age of Onset  . Diabetes Other     Social History Social History   Tobacco Use  . Smoking status: Current Every Day Smoker    Packs/day: 0.50  . Smokeless tobacco: Never Used  Substance Use Topics  . Alcohol use: No    Frequency: Never  . Drug use: No     Allergies   Hydrocodone   Review of Systems Review of Systems  Constitutional: Positive for chills. Negative for fever.  HENT: Positive for dental problem and facial swelling. Negative for drooling, ear discharge, ear pain, sore throat and trouble swallowing.        +trismus due to pain  Respiratory:  Negative for shortness of breath.   Cardiovascular: Negative for chest pain.  Gastrointestinal: Negative for abdominal pain, constipation, diarrhea, nausea and vomiting.  Genitourinary: Negative for dysuria and hematuria.  Musculoskeletal: Negative for arthralgias and myalgias.  Skin: Negative for color change.  Allergic/Immunologic: Negative for immunocompromised state.  Neurological: Negative for weakness and numbness.  Psychiatric/Behavioral: Negative for confusion.   All other systems reviewed and are negative for acute change except as noted in the HPI.    Physical Exam Updated Vital Signs BP (!) 147/99 (BP Location: Left Arm)   Pulse (!) 101   Temp 98.8 F (37.1 C) (Oral)   Resp 18   SpO2 96%   Physical Exam  Constitutional: He is oriented to person, place, and time. Vital signs are normal. He appears well-developed and well-nourished.  Non-toxic appearance. No distress.  Afebrile, nontoxic, appears uncomfortable but still in NAD  HENT:  Head: Normocephalic and atraumatic.  Right Ear: Hearing, tympanic membrane, external ear and ear canal normal.  Left Ear: Hearing, tympanic membrane, external ear and ear canal normal.  Nose: Nose normal.  Mouth/Throat: Uvula is midline, oropharynx is clear and moist and mucous membranes are normal. No trismus in the jaw. Dental abscesses and dental caries present. No uvula swelling. Tonsils are 0 on the right. Tonsils are 0 on the left. No tonsillar exudate.    Ears are clear bilaterally. Nose clear.  L lower molar #19 with temporary partial filling in place, with surrounding gingival erythema and swelling, +dental abscess which spontaneously ruptured during evaluation and produced purulent drainage from a small opening in the side of the abscess, moderately TTP, no evidence of ludwig's, no trismus or drooling, handling secretions well. Oropharynx clear and moist, without uvular swelling or deviation, no trismus or drooling, no tonsillar  swelling or erythema, no exudates.    Eyes: Conjunctivae and EOM are normal. Right eye exhibits no discharge. Left eye exhibits no discharge.  Neck: Normal range of motion. Neck supple.  Cardiovascular: Normal rate, regular rhythm, normal heart sounds and intact distal pulses. Exam reveals no gallop and no friction rub.  No murmur heard. Tachycardia reported in triage resolved upon exam  Pulmonary/Chest: Effort normal and breath sounds normal. No respiratory distress. He has no decreased breath sounds. He has no wheezes. He has no rhonchi. He has no rales.  Abdominal: Soft. Normal appearance and bowel sounds are normal. He exhibits no distension. There is no tenderness. There is no rigidity, no rebound, no guarding, no CVA tenderness, no tenderness at McBurney's point and negative Murphy's sign.  Musculoskeletal: Normal range of motion.  Neurological: He is alert and oriented to person, place, and time. He has normal strength. No sensory deficit.  Skin: Skin is warm, dry and intact. No rash noted.  Psychiatric: He has a normal  mood and affect.  Nursing note and vitals reviewed.    ED Treatments / Results  Labs (all labs ordered are listed, but only abnormal results are displayed) Labs Reviewed - No data to display  Results for orders placed or performed during the hospital encounter of 04/15/17  Basic metabolic panel  Result Value Ref Range   Sodium 137 135 - 145 mmol/L   Potassium 3.8 3.5 - 5.1 mmol/L   Chloride 105 101 - 111 mmol/L   CO2 20 (L) 22 - 32 mmol/L   Glucose, Bld 89 65 - 99 mg/dL   BUN 15 6 - 20 mg/dL   Creatinine, Ser 1.61 0.61 - 1.24 mg/dL   Calcium 9.7 8.9 - 09.6 mg/dL   GFR calc non Af Amer >60 >60 mL/min   GFR calc Af Amer >60 >60 mL/min   Anion gap 12 5 - 15  CBC  Result Value Ref Range   WBC 13.4 (H) 4.0 - 10.5 K/uL   RBC 4.83 4.22 - 5.81 MIL/uL   Hemoglobin 15.5 13.0 - 17.0 g/dL   HCT 04.5 40.9 - 81.1 %   MCV 92.5 78.0 - 100.0 fL   MCH 32.1 26.0 - 34.0 pg    MCHC 34.7 30.0 - 36.0 g/dL   RDW 91.4 78.2 - 95.6 %   Platelets 275 150 - 400 K/uL  CBG monitoring, ED  Result Value Ref Range   Glucose-Capillary 104 (H) 65 - 99 mg/dL   Comment 1 Notify RN    Comment 2 Document in Chart     EKG  EKG Interpretation None       Radiology No results found.  Procedures Procedures (including critical care time)  Medications Ordered in ED Medications - No data to display   Initial Impression / Assessment and Plan / ED Course  I have reviewed the triage vital signs and the nursing notes.  Pertinent labs & imaging results that were available during my care of the patient were reviewed by me and considered in my medical decision making (see chart for details).     32 y.o. male here with L dental pain/swelling x2wks with mild trismus due to pain and chills. Was seen last night for same, had CBC which showed mildly elevated WBC 13.4, BMP overall unremarkable. He left prior to being seen. On exam today, appears uncomfortable, afebrile and still nontoxic, L lower molar #19 with temporary filling in place, and adjacent gum swelling which spontaneously ruptured during exam and was productive of purulent material. No drooling or trismus, handling secretions well, no evidence of ludwig's. Doubt need for repeat labs or imaging at this time, and doubt need for I&D since this abscess has started to drain spontaneously. I gave patient referral to dentist and stressed the importance of dental follow up for ultimate management of dental pain.  I have also discussed reasons to return immediately to the ER.  Patient expresses understanding and agrees with plan.  I will also give PCN VK and pain control, discussed additional OTC remedies for pain control. Smoking cessation advised.   [place] reviewed prior to dispensing controlled substance medications, and 2 year search was notable for: Tramadol 50mg  #30 tabs on 04/15/15, no other controlled substances found.  Risks/benefits/alternatives and expectations discussed regarding controlled substances. Side effects of medications discussed. Informed consent obtained.    Final Clinical Impressions(s) / ED Diagnoses   Final diagnoses:  Dental abscess  Dentalgia  Pain due to dental caries  Tobacco user  ED Discharge Orders        Ordered    penicillin v potassium (VEETID) 500 MG tablet  2 times daily     04/15/17 2118    naproxen (NAPROSYN) 500 MG tablet  2 times daily PRN     04/15/17 2118    oxyCODONE-acetaminophen (PERCOCET) 5-325 MG tablet  Every 6 hours PRN     04/15/17 2118       Chanee Henrickson, [place], [place] 04/15/17 2138    Vanetta Mulders, MD 04/16/17 [id number]

## 2017-04-15 NOTE — Discharge Instructions (Signed)
Alternate between warm and cool compresses to jaw throughout the day. Take antibiotic until finished. Alternate between naprosyn and percocet as directed as needed for pain but don't drive while taking percocet. Perform salt water swishes to help with pain/swelling. Use over the counter oragel as needed for additional relief. STOP SMOKING! Followup with a dentist is very important for ongoing evaluation and management of recurrent dental pain, call the dentist listed above in the next 24-48 hours to schedule ongoing dental care, or use the list below to find a dentist in the next 24-48 hours for ongoing management of your dental issue. Return to emergency department for emergent changing or worsening symptoms.

## 2017-04-15 NOTE — ED Triage Notes (Signed)
Pt states he has an abscessed tooth on the left bottom  Pt states it started about a week and a half ago  Pt has swelling noted to the left side of his face

## 2017-06-07 ENCOUNTER — Emergency Department (HOSPITAL_COMMUNITY): Payer: Self-pay

## 2017-06-07 ENCOUNTER — Other Ambulatory Visit: Payer: Self-pay

## 2017-06-07 ENCOUNTER — Observation Stay (HOSPITAL_COMMUNITY)
Admission: EM | Admit: 2017-06-07 | Discharge: 2017-06-08 | Disposition: A | Discharge status: Home / Self Care | Payer: Self-pay | Attending: Internal Medicine | Admitting: Internal Medicine

## 2017-06-07 ENCOUNTER — Encounter (HOSPITAL_COMMUNITY): Payer: Self-pay | Admitting: Emergency Medicine

## 2017-06-07 DIAGNOSIS — F14929 Cocaine use, unspecified with intoxication, unspecified: Secondary | ICD-10-CM | POA: Diagnosis present

## 2017-06-07 DIAGNOSIS — E875 Hyperkalemia: Secondary | ICD-10-CM | POA: Insufficient documentation

## 2017-06-07 DIAGNOSIS — F1290 Cannabis use, unspecified, uncomplicated: Secondary | ICD-10-CM | POA: Insufficient documentation

## 2017-06-07 DIAGNOSIS — T405X4A Poisoning by cocaine, undetermined, initial encounter: Principal | ICD-10-CM | POA: Insufficient documentation

## 2017-06-07 DIAGNOSIS — F17210 Nicotine dependence, cigarettes, uncomplicated: Secondary | ICD-10-CM | POA: Insufficient documentation

## 2017-06-07 DIAGNOSIS — R000 Tachycardia, unspecified: Secondary | ICD-10-CM | POA: Insufficient documentation

## 2017-06-07 DIAGNOSIS — R030 Elevated blood-pressure reading, without diagnosis of hypertension: Secondary | ICD-10-CM | POA: Insufficient documentation

## 2017-06-07 DIAGNOSIS — T50904A Poisoning by unspecified drugs, medicaments and biological substances, undetermined, initial encounter: Secondary | ICD-10-CM

## 2017-06-07 DIAGNOSIS — F1410 Cocaine abuse, uncomplicated: Secondary | ICD-10-CM | POA: Diagnosis present

## 2017-06-07 DIAGNOSIS — Y92009 Unspecified place in unspecified non-institutional (private) residence as the place of occurrence of the external cause: Secondary | ICD-10-CM | POA: Insufficient documentation

## 2017-06-07 DIAGNOSIS — Z885 Allergy status to narcotic agent status: Secondary | ICD-10-CM | POA: Insufficient documentation

## 2017-06-07 LAB — COMPREHENSIVE METABOLIC PANEL
ALBUMIN: 4.8 g/dL (ref 3.5–5.0)
ALK PHOS: 58 U/L (ref 38–126)
ALT: 28 U/L (ref 17–63)
ALT: 22 U/L (ref 17–63)
ANION GAP: 13 (ref 5–15)
ANION GAP: 9 (ref 5–15)
AST: 35 U/L (ref 15–41)
AST: 22 U/L (ref 15–41)
Albumin: 4.3 g/dL (ref 3.5–5.0)
Alkaline Phosphatase: 59 U/L (ref 38–126)
BILIRUBIN TOTAL: 1.3 mg/dL — AB (ref 0.3–1.2)
BUN: 13 mg/dL (ref 6–20)
BUN: 15 mg/dL (ref 6–20)
CALCIUM: 9.5 mg/dL (ref 8.9–10.3)
CO2: 21 mmol/L — ABNORMAL LOW (ref 22–32)
CO2: 24 mmol/L (ref 22–32)
CREATININE: 1.03 mg/dL (ref 0.61–1.24)
Calcium: 9.5 mg/dL (ref 8.9–10.3)
Chloride: 101 mmol/L (ref 101–111)
Chloride: 104 mmol/L (ref 101–111)
Creatinine, Ser: 0.86 mg/dL (ref 0.61–1.24)
GFR calc Af Amer: >60 mL/min (ref >60)
GFR calc non Af Amer: >60 mL/min (ref >60)
GFR calc non Af Amer: >60 mL/min (ref >60)
GLUCOSE: 103 mg/dL — AB (ref 65–99)
Glucose, Bld: 116 mg/dL — ABNORMAL HIGH (ref 65–99)
Potassium: 5.3 mmol/L — ABNORMAL HIGH (ref 3.5–5.1)
Potassium: 3.8 mmol/L (ref 3.5–5.1)
SODIUM: 137 mmol/L (ref 135–145)
Sodium: 135 mmol/L (ref 135–145)
TOTAL PROTEIN: 8 g/dL (ref 6.5–8.1)
Total Bilirubin: 1 mg/dL (ref 0.3–1.2)
Total Protein: 7 g/dL (ref 6.5–8.1)

## 2017-06-07 LAB — CBC
HCT: 43 % (ref 39.0–52.0)
HEMATOCRIT: 43.3 % (ref 39.0–52.0)
Hemoglobin: 15 g/dL (ref 13.0–17.0)
Hemoglobin: 14.8 g/dL (ref 13.0–17.0)
MCH: 32.1 pg (ref 26.0–34.0)
MCH: 32 pg (ref 26.0–34.0)
MCHC: 34.6 g/dL (ref 30.0–36.0)
MCHC: 34.4 g/dL (ref 30.0–36.0)
MCV: 92.5 fL (ref 78.0–100.0)
MCV: 92.9 fL (ref 78.0–100.0)
Platelets: 214 10*3/uL (ref 150–400)
Platelets: 185 10*3/uL (ref 150–400)
RBC: 4.68 MIL/uL (ref 4.22–5.81)
RBC: 4.63 MIL/uL (ref 4.22–5.81)
RDW: 13.6 % (ref 11.5–15.5)
RDW: 13.6 % (ref 11.5–15.5)
WBC: 11.6 10*3/uL — AB (ref 4.0–10.5)
WBC: 9.5 10*3/uL (ref 4.0–10.5)

## 2017-06-07 LAB — RAPID URINE DRUG SCREEN, HOSP PERFORMED
Amphetamines: POSITIVE — AB
BARBITURATES: NOT DETECTED
Benzodiazepines: NOT DETECTED
COCAINE: POSITIVE — AB
Opiates: NOT DETECTED
Tetrahydrocannabinol: POSITIVE — AB

## 2017-06-07 LAB — CBC WITH DIFFERENTIAL/PLATELET
BASOS PCT: 0 %
Basophils Absolute: 0 10*3/uL (ref 0.0–0.1)
EOS ABS: 0.1 10*3/uL (ref 0.0–0.7)
Eosinophils Relative: 1 %
HEMATOCRIT: 44.5 % (ref 39.0–52.0)
HEMOGLOBIN: 15.5 g/dL (ref 13.0–17.0)
Lymphocytes Relative: 21 %
Lymphs Abs: 2.2 10*3/uL (ref 0.7–4.0)
MCH: 32 pg (ref 26.0–34.0)
MCHC: 34.8 g/dL (ref 30.0–36.0)
MCV: 91.9 fL (ref 78.0–100.0)
Monocytes Absolute: 0.9 10*3/uL (ref 0.1–1.0)
Monocytes Relative: 8 %
NEUTROS ABS: 7.1 10*3/uL (ref 1.7–7.7)
NEUTROS PCT: 70 %
Platelets: 204 10*3/uL (ref 150–400)
RBC: 4.84 MIL/uL (ref 4.22–5.81)
RDW: 13.5 % (ref 11.5–15.5)
WBC: 10.2 10*3/uL (ref 4.0–10.5)

## 2017-06-07 LAB — MRSA PCR SCREENING: MRSA by PCR: NEGATIVE

## 2017-06-07 LAB — SALICYLATE LEVEL

## 2017-06-07 LAB — ETHANOL: Alcohol, Ethyl (B): <10 mg/dL (ref <10)

## 2017-06-07 LAB — ACETAMINOPHEN LEVEL

## 2017-06-07 MED ORDER — ACETAMINOPHEN 325 MG PO TABS
650.0000 mg | ORAL_TABLET | Freq: Four times a day (QID) | ORAL | Status: DC | PRN
  Administered 2017-06-07: 650 mg via ORAL
  Filled 2017-06-07: qty 2

## 2017-06-07 MED ORDER — LORAZEPAM BOLUS VIA INFUSION: 0.5000 mg | INTRAVENOUS | Status: DC | PRN

## 2017-06-07 MED ORDER — ENOXAPARIN SODIUM 40 MG/0.4ML ~~LOC~~ SOLN
40.0000 mg | SUBCUTANEOUS | Status: DC
  Administered 2017-06-07: 40 mg via SUBCUTANEOUS
  Filled 2017-06-07 (×2): qty 0.4

## 2017-06-07 MED ORDER — LORAZEPAM BOLUS VIA INFUSION
0.5000 mg | INTRAVENOUS | Status: DC | PRN
  Filled 2017-06-07: qty 1

## 2017-06-07 MED ORDER — QUETIAPINE FUMARATE ER 50 MG PO TB24
100.0000 mg | ORAL_TABLET | Freq: Every day | ORAL | Status: DC
  Administered 2017-06-07: 100 mg via ORAL
  Filled 2017-06-07 (×2): qty 2

## 2017-06-07 MED ORDER — NICOTINE 21 MG/24HR TD PT24
21.0000 mg | MEDICATED_PATCH | Freq: Every day | TRANSDERMAL | Status: DC
  Administered 2017-06-07 – 2017-06-08 (×2): 21 mg via TRANSDERMAL
  Filled 2017-06-07 (×2): qty 1

## 2017-06-07 MED ORDER — SODIUM CHLORIDE 0.45 % IV SOLN
INTRAVENOUS | Status: DC
  Administered 2017-06-07 – 2017-06-08 (×4): via INTRAVENOUS

## 2017-06-07 MED ORDER — LORAZEPAM 2 MG/ML IJ SOLN
2.0000 mg | Freq: Once | INTRAMUSCULAR | Status: DC
  Filled 2017-06-07: qty 1

## 2017-06-07 MED ORDER — LORAZEPAM 2 MG/ML IJ SOLN
0.5000 mg | INTRAMUSCULAR | Status: DC | PRN
  Administered 2017-06-07 (×2): 0.5 mg via INTRAVENOUS
  Filled 2017-06-07 (×2): qty 1

## 2017-06-07 NOTE — ED Notes (Signed)
As this Clinical research associatewriter attempted to transport patient upstairs, patient told myself as well as [employer] that he had to be at work in four hours and that he needed to leave. Charge nurse made aware. Security and [employer] at bedside. Decision has been made to IVC patient. Care team notified.

## 2017-06-07 NOTE — ED Notes (Signed)
EKG given to EDP,Wickline,MD., for review. 

## 2017-06-07 NOTE — ED Triage Notes (Signed)
Pt presents by EMS for reported ingestion of a bag of cocaine tonight. EMS reports that pt family had stated he has not slept in 2 days and had used Meth. Pt also became combative during time on scene and pt presents with [employer] in hand cuffs .

## 2017-06-07 NOTE — ED Notes (Signed)
Patient currently refusing medication, [employer] at bedside with patient. Provider aware, and mentioned that the patient as not competent enough to make decisions. IV started, medication not given per patient refusal (see mar.)

## 2017-06-07 NOTE — H&P (Signed)
History and Physical    Joseph LindenMaurice C Steadman JYN:[id number] DOB: 07/29/1985 DOA: 06/07/2017  PCP: Fleet ContrasAvbuere, Edwin, MD Patient coming from: Home  Chief Complaint: Swallowed a bag of cocaine  HPI: Joseph Bernard is a 32 y.o. male with no significant past medical history other than tobacco abuse brought in by EMS for ingestion of a bag of cocaine.  Family also reported that he has not slept in 2 days and have been using meth  Amphetamines.  Patient reported that at baseline he smokes marijuana every day and smokes cigarettes.  What he did today or tonight is he got scared of the police outside his house and he just wanted to get rid of the cocaine so he ingested the whole cocaine with a bag he just ate it.  He said he has never done this before he said he has no suicidal ideations.  He has a 33-year-old child and his girlfriend is pregnant with a second child.  When I asked him about the children patient started tearing up.  He works as a Production designer, theatre/television/filmmanager at  [employer].  He denies any chest pain shortness of breath nausea vomiting diarrhea headaches changes with his vision.    ED Course: When he first arrived to the ER he was hypertensive tachycardic and afebrile.  Ativan 2 mg was ordered but patient refused.  Sodium was 135 potassium 5.3 BUN 13 creatinine 1.03 his anion gap is 13 LFTs normal white count 10.2 hemoglobin 15.5 platelets 204.  Tylenol level was less than 10 salicylates less than 7 glucose 103 alcohol level less than 10 urine drug screen was positive for amphetamines cocaine and marijuana.  KUB  was done which was normal chest x-ray normal, EKG sinus tachycardia no acute ST-T wave changes.  ED physician spoke to P [facility]CM and was told to call [facility] to admit.  Review of Systems: As per HPI otherwise all other systems reviewed and are negative    Past Medical History:  Diagnosis Date  . No pertinent past medical history     Past Surgical History:  Procedure Laterality Date  . dental extractions      . TENDON REPAIR  03/17/2012   Procedure: TENDON REPAIR;  Surgeon: Dominica SeverinWilliam Gramig, MD;  [facility];  Service: Orthopedics;  Laterality: Right;  Irrigation & Debridement and Repair Nerve and Tendon Right Wrist and Hand    Social History   Socioeconomic History  . Marital status: Single    Spouse name: Not on file  . Number of children: Not on file  . Years of education: Not on file  . Highest education level: Not on file  Occupational History  . Not on file  Social Needs  . Financial resource strain: Not on file  . Food insecurity:    Worry: Not on file    Inability: Not on file  . Transportation needs:    Medical: Not on file    Non-medical: Not on file  Tobacco Use  . Smoking status: Current Every Day Smoker    Packs/day: 0.50  . Smokeless tobacco: Never Used  Substance and Sexual Activity  . Alcohol use: No    Frequency: Never  . Drug use: Yes    Types: Methamphetamines, Cocaine  . Sexual activity: Not on file  Lifestyle  . Physical activity:    Days per week: Not on file    Minutes per session: Not on file  . Stress: Not on file  Relationships  . Social connections:  Talks on phone: Not on file    Gets together: Not on file    Attends religious service: Not on file    Active member of club or organization: Not on file    Attends meetings of clubs or organizations: Not on file    Relationship status: Not on file  . Intimate partner violence:    Fear of current or ex partner: Not on file    Emotionally abused: Not on file    Physically abused: Not on file    Forced sexual activity: Not on file  Other Topics Concern  . Not on file  Social History Narrative  . Not on file    Allergies  Allergen Reactions  . Hydrocodone Itching and Other (See Comments)    Skin burning    Family History  Problem Relation Age of Onset  . Diabetes Other     Prior to Admission medications   Medication Sig Start Date End Date Taking? Authorizing Provider   acetaminophen (TYLENOL) 325 MG tablet Take 650 mg by mouth every 6 (six) hours as needed for moderate pain.    [provider]  amphetamine-dextroamphetamine (ADDERALL XR) 30 MG 24 hr capsule Take 30 mg by mouth daily.    [provider]  dicyclomine (BENTYL) 20 MG tablet Take 1 tablet (20 mg total) by mouth 2 (two) times daily. 02/24/17   Dartha Lodge, PA-C  docusate sodium (COLACE) 250 MG capsule Take 1 capsule (250 mg total) by mouth daily. 02/24/17   Dartha Lodge, PA-C  naproxen (NAPROSYN) 500 MG tablet Take 1 tablet (500 mg total) by mouth 2 (two) times daily as needed for mild pain, moderate pain or headache (TAKE WITH MEALS.). 04/15/17   Street, [place], PA-C  ondansetron (ZOFRAN ODT) 4 MG disintegrating tablet 4mg  ODT q4 hours prn nausea/vomit 02/24/17   Dartha Lodge, PA-C  oxyCODONE-acetaminophen (PERCOCET) 5-325 MG tablet Take 1 tablet by mouth every 6 (six) hours as needed for severe pain. 04/15/17   Street, [place], PA-C  penicillin v potassium (VEETID) 500 MG tablet Take 2 tablets (1,000 mg total) by mouth 2 (two) times daily. X 7 days 04/15/17   Street, [place], [place]    Physical Exam: Vitals:   06/07/17 0653 06/07/17 0654 06/07/17 0834 06/07/17 0900  BP:  (!) 151/100 (!) 135/93 (!) 109/96  Pulse:  (!) 108 93 87  Resp:  15 20 20   Temp:  98.2 F (36.8 C)    TempSrc:  Oral    SpO2:  98% 95% 95%  Weight: 79.4 kg (175 lb)     Height: 6' (1.829 m)        General: Appears calm and tearful Eyes:  PERRL, EOMI, normal lids, iris ENT:  grossly normal hearing, lips & tongue, mmm Neck:  no LAD, masses or thyromegaly Cardiovascular:  RRR, no m/r/g. No LE edema.  Respiratory: CTA bilaterally, no w/r/r. Normal respiratory effort. Abdomen:  soft, ntnd, NABS Skin:  no rash or induration seen on limited exam Musculoskeletal:  grossly normal tone BUE/BLE, good ROM, no bony abnormality Psychiatric: grossly normal mood and affect, speech fluent and appropriate,  AOx3 Neurologic:  CN 2-12 grossly intact, moves all extremities in coordinated fashion, sensation intact  Labs on Admission: I have personally reviewed following labs and imaging studies  CBC: Recent Labs  Lab 06/07/17 0735  WBC 10.2  NEUTROABS 7.1  HGB 15.5  HCT 44.5  MCV 91.9  PLT 204   Basic Metabolic Panel: Recent Labs  Lab 06/07/17 0734  NA 135  K 5.3*  CL 101  CO2 21*  GLUCOSE 103*  BUN 13  CREATININE 1.03  CALCIUM 9.5   GFR: Estimated Creatinine Clearance: 113 mL/min (by C-G formula based on SCr of 1.03 mg/dL). Liver Function Tests: Recent Labs  Lab 06/07/17 0734  AST 35  ALT 28  ALKPHOS 59  BILITOT 1.3*  PROT 8.0  ALBUMIN 4.8   No results for input(s): LIPASE, AMYLASE in the last 168 hours. No results for input(s): AMMONIA in the last 168 hours. Coagulation Profile: No results for input(s): INR, PROTIME in the last 168 hours. Cardiac Enzymes: No results for input(s): CKTOTAL, CKMB, CKMBINDEX, TROPONINI in the last 168 hours. BNP (last 3 results) No results for input(s): PROBNP in the last 8760 hours. HbA1C: No results for input(s): HGBA1C in the last 72 hours. CBG: No results for input(s): GLUCAP in the last 168 hours. Lipid Profile: No results for input(s): CHOL, HDL, LDLCALC, TRIG, CHOLHDL, LDLDIRECT in the last 72 hours. Thyroid Function Tests: No results for input(s): TSH, T4TOTAL, FREET4, T3FREE, THYROIDAB in the last 72 hours. Anemia Panel: No results for input(s): VITAMINB12, FOLATE, FERRITIN, TIBC, IRON, RETICCTPCT in the last 72 hours. Urine analysis:    Component Value Date/Time   COLORURINE AMBER (A) 11/26/2014 1633   APPEARANCEUR CLEAR 11/26/2014 1633   LABSPEC 1.034 (H) 11/26/2014 1633   PHURINE 6.0 11/26/2014 1633   GLUCOSEU NEGATIVE 11/26/2014 1633   HGBUR NEGATIVE 11/26/2014 1633   BILIRUBINUR SMALL (A) 11/26/2014 1633   KETONESUR 40 (A) 11/26/2014 1633   PROTEINUR NEGATIVE 11/26/2014 1633   UROBILINOGEN 1.0 11/26/2014  1633   NITRITE NEGATIVE 11/26/2014 1633   LEUKOCYTESUR NEGATIVE 11/26/2014 1633    Creatinine Clearance: Estimated Creatinine Clearance: 113 mL/min (by C-G formula based on SCr of 1.03 mg/dL).  Sepsis Labs: @LABRCNTIP (procalcitonin:4,lacticidven:4) )No results found for this or any previous visit (from the past 240 hour(s)).   Radiological Exams on Admission: Dg Abd Acute W/chest  Result Date: 06/07/2017 CLINICAL DATA:  Patient stated he swallowed a bag of cocaine. Looking for foreign body. Semi-erect done due to patient being uncooperative. No distress. EXAM: DG ABDOMEN ACUTE W/ 1V CHEST COMPARISON:  CT scan, 02/23/2017 FINDINGS: Normal bowel gas pattern. No evidence of a radiopaque ingested foreign body. No free air. No renal or ureteral stones.  Soft tissues are unremarkable. Normal heart, mediastinum and hila.  Clear lungs. Skeletal structures are unremarkable. IMPRESSION: Negative abdominal radiographs. No evidence of an ingested radiopaque foreign body. No acute cardiopulmonary disease. Electronically Signed   By: Amie Portland M.D.   On: 06/07/2017 08:12    EKG: Independently reviewed.  Assessment/Plan Active Problems:   Cocaine abuse (HCC)   1] multidrug abuse with cocaine/marijuana-patient reported he ingested a whole bag of cocaine.  Family reported that he has been using methamphetamines for the last 2 days without sleep.  He does take the prescription Adderall which has amphetamines.  Patient is awake alert and oriented x3.  He reports that he usually does only smoke read.  He felt that he he is stupid to do all these things today.  He reported he did not do this for suicide intention.  Will admit him to ICU for close monitoring IV fluids IV Ativan as needed follow-up EKGs and CBC and CMP again today and tomorrow morning.  Denies use of alcohol.  2] tobacco abuse nicotine patch  3] hypokalemia K is 5.3 follow-up BMP if it still elevated will treat.  Renal functions normal at  this time   DVT prophylaxis: Lovenox Code Status: Full code Family Communication: No family available  disposition Plan: TBD Consults called: None Admission status: Inpatient   Alwyn Ren MD [facility]  If 7PM-7AM, please contact night-coverage [email or web address] Password [facility]  06/07/2017, 9:33 AM

## 2017-06-07 NOTE — ED Provider Notes (Signed)
[facility]-EMERGENCY DEPT Provider Note   CSN: [id number] Arrival date & time: 06/07/17  [id number]     History   Chief Complaint Chief Complaint  Patient presents with  . Drug Overdose    HPI Ninfa LindenMaurice C Mundis is a 32 y.o. male.  The history is provided by the patient and the police. The history is limited by the condition of the patient (Uncooperative).  Drug Overdose   Pt was seen at 0710.  Per EMS and Police: Pt ingested a bag of cocaine PTA. EMS states family informed them pt has been using meth for 2 days and not slept. Pt combative with EMS and Police. Pt not forthcoming regarding what he ingested, how much he ingested, or why he ingested it.     Past Medical History:  Diagnosis Date  . No pertinent past medical history     There are no active problems to display for this patient.   Past Surgical History:  Procedure Laterality Date  . dental extractions    . TENDON REPAIR  03/17/2012   Procedure: TENDON REPAIR;  Surgeon: Dominica SeverinWilliam Gramig, MD;  [facility];  Service: Orthopedics;  Laterality: Right;  Irrigation & Debridement and Repair Nerve and Tendon Right Wrist and Hand        Home Medications    Prior to Admission medications   Medication Sig Start Date End Date Taking? Authorizing Provider  acetaminophen (TYLENOL) 325 MG tablet Take 650 mg by mouth every 6 (six) hours as needed for moderate pain.    [provider]  amphetamine-dextroamphetamine (ADDERALL XR) 30 MG 24 hr capsule Take 30 mg by mouth daily.    [provider]  dicyclomine (BENTYL) 20 MG tablet Take 1 tablet (20 mg total) by mouth 2 (two) times daily. 02/24/17   Dartha LodgeFord, Kelsey N, PA-C  docusate sodium (COLACE) 250 MG capsule Take 1 capsule (250 mg total) by mouth daily. 02/24/17   Dartha LodgeFord, Kelsey N, PA-C  naproxen (NAPROSYN) 500 MG tablet Take 1 tablet (500 mg total) by mouth 2 (two) times daily as needed for mild pain, moderate pain or headache (TAKE WITH  MEALS.). 04/15/17   Street, [place]Mercedes, PA-C  ondansetron (ZOFRAN ODT) 4 MG disintegrating tablet 4mg  ODT q4 hours prn nausea/vomit 02/24/17   Dartha LodgeFord, Kelsey N, PA-C  oxyCODONE-acetaminophen (PERCOCET) 5-325 MG tablet Take 1 tablet by mouth every 6 (six) hours as needed for severe pain. 04/15/17   Street, [place]cedes, PA-C  penicillin v potassium (VEETID) 500 MG tablet Take 2 tablets (1,000 mg total) by mouth 2 (two) times daily. X 7 days 04/15/17   Street, [place], PA-C    Family History Family History  Problem Relation Age of Onset  . Diabetes Other     Social History Social History   Tobacco Use  . Smoking status: Current Every Day Smoker    Packs/day: 0.50  . Smokeless tobacco: Never Used  Substance Use Topics  . Alcohol use: No    Frequency: Never  . Drug use: Yes    Types: Methamphetamines, Cocaine     Allergies   Hydrocodone   Review of Systems Review of Systems  Unable to perform ROS: Other     Physical Exam Updated Vital Signs BP (!) 151/100 (BP Location: Left Arm)   Pulse (!) 108   Temp 98.2 F (36.8 C) (Oral)   Resp 15   Ht 6' (1.829 m)   Wt 79.4 kg (175 lb)   SpO2 98%   BMI 23.73  kg/m    Patient Vitals for the past 24 hrs:  BP Temp Temp src Pulse Resp SpO2 Height Weight  06/07/17 0834 (!) 135/93 - - 93 20 95 % - -  06/07/17 0654 (!) 151/100 98.2 F (36.8 C) Oral (!) 108 15 98 % - -  06/07/17 0653 - - - - - - 6' (1.829 m) 79.4 kg (175 lb)     Physical Exam 0715: Physical examination:  Nursing notes reviewed; Vital signs and O2 SAT reviewed;  Constitutional: Well developed, Well nourished, Well hydrated, In no acute distress; Head:  Normocephalic, atraumatic; Eyes: EOMI, PERRL, No scleral icterus; ENMT: Mouth and pharynx normal, Mucous membranes moist; Neck: Supple, Full range of motion, No lymphadenopathy; Cardiovascular: Tachycardic rate and rhythm, No gallop; Respiratory: Breath sounds clear & equal bilaterally, No wheezes. Speaking with ease. Normal  respiratory effort/excursion; Chest: Nontender, Movement normal; Abdomen: Soft, Nontender, Nondistended, Normal bowel sounds; Genitourinary: No CVA tenderness; Extremities: Peripheral pulses normal, No tenderness, No edema, No calf edema or asymmetry.; Neuro: Awake, alert. Not forthcoming with answers to questions. No facial droop. Speech clear. Handcuffed to stretcher but will move all extremities spontaneously without apparent gross focal motor deficits.; Skin: Color normal, Warm, Dry.; Psych:  Agitated.     ED Treatments / Results  Labs (all labs ordered are listed, but only abnormal results are displayed)   EKG EKG Interpretation  Date/Time:  Monday June 07 2017 06:56:59 EDT Ventricular Rate:  102 PR Interval:    QRS Duration: 87 QT Interval:  321 QTC Calculation: 419 R Axis:   68 Text Interpretation:  Sinus tachycardia Probable left atrial enlargement No significant change since last tracing Confirmed by Zadie Rhine ([id number]) on 06/07/2017 7:02:32 AM   Radiology   Procedures Procedures (including critical care time)  Medications Ordered in ED Medications  LORazepam (ATIVAN) injection 2 mg (2 mg Intravenous Refused 06/07/17 0740)     Initial Impression / Assessment and Plan / ED Course  I have reviewed the triage vital signs and the nursing notes.  Pertinent labs & imaging results that were available during my care of the patient were reviewed by me and considered in my medical decision making (see chart for details).  MDM Reviewed: previous chart, nursing note and vitals Reviewed previous: labs and ECG Interpretation: labs, ECG and x-ray   Results for orders placed or performed during the hospital encounter of 06/07/17  Comprehensive metabolic panel  Result Value Ref Range   Sodium 135 135 - 145 mmol/L   Potassium 5.3 (H) 3.5 - 5.1 mmol/L   Chloride 101 101 - 111 mmol/L   CO2 21 (L) 22 - 32 mmol/L   Glucose, Bld 103 (H) 65 - 99 mg/dL   BUN 13 6 - 20 mg/dL    Creatinine, Ser 5.28 0.61 - 1.24 mg/dL   Calcium 9.5 8.9 - 41.3 mg/dL   Total Protein 8.0 6.5 - 8.1 g/dL   Albumin 4.8 3.5 - 5.0 g/dL   AST 35 15 - 41 U/L   ALT 28 17 - 63 U/L   Alkaline Phosphatase 59 38 - 126 U/L   Total Bilirubin 1.3 (H) 0.3 - 1.2 mg/dL   GFR calc non Af Amer >60 >60 mL/min   GFR calc Af Amer >60 >60 mL/min   Anion gap 13 5 - 15  Ethanol  Result Value Ref Range   Alcohol, Ethyl (B) <10 <10 mg/dL  Rapid urine drug screen (hospital performed)  Result Value Ref Range   Opiates  NONE DETECTED NONE DETECTED   Cocaine POSITIVE (A) NONE DETECTED   Benzodiazepines NONE DETECTED NONE DETECTED   Amphetamines POSITIVE (A) NONE DETECTED   Tetrahydrocannabinol POSITIVE (A) NONE DETECTED   Barbiturates NONE DETECTED NONE DETECTED  Acetaminophen level  Result Value Ref Range   Acetaminophen (Tylenol), Serum <10 (L) 10 - 30 ug/mL  CBC with Differential  Result Value Ref Range   WBC 10.2 4.0 - 10.5 K/uL   RBC 4.84 4.22 - 5.81 MIL/uL   Hemoglobin 15.5 13.0 - 17.0 g/dL   HCT 16.1 09.6 - 04.5 %   MCV 91.9 78.0 - 100.0 fL   MCH 32.0 26.0 - 34.0 pg   MCHC 34.8 30.0 - 36.0 g/dL   RDW 40.9 81.1 - 91.4 %   Platelets 204 150 - 400 K/uL   Neutrophils Relative % 70 %   Neutro Abs 7.1 1.7 - 7.7 K/uL   Lymphocytes Relative 21 %   Lymphs Abs 2.2 0.7 - 4.0 K/uL   Monocytes Relative 8 %   Monocytes Absolute 0.9 0.1 - 1.0 K/uL   Eosinophils Relative 1 %   Eosinophils Absolute 0.1 0.0 - 0.7 K/uL   Basophils Relative 0 %   Basophils Absolute 0.0 0.0 - 0.1 K/uL  Salicylate level  Result Value Ref Range   Salicylate Lvl <7.0 2.8 - 30.0 mg/dL   Dg Abd Acute W/chest Result Date: 06/07/2017 CLINICAL DATA:  Patient stated he swallowed a bag of cocaine. Looking for foreign body. Semi-erect done due to patient being uncooperative. No distress. EXAM: DG ABDOMEN ACUTE W/ 1V CHEST COMPARISON:  CT scan, 02/23/2017 FINDINGS: Normal bowel gas pattern. No evidence of a radiopaque ingested foreign  body. No free air. No renal or ureteral stones.  Soft tissues are unremarkable. Normal heart, mediastinum and hila.  Clear lungs. Skeletal structures are unremarkable. IMPRESSION: Negative abdominal radiographs. No evidence of an ingested radiopaque foreign body. No acute cardiopulmonary disease. Electronically Signed   By: Amie Portland M.D.   On: 06/07/2017 08:12    0840:  Pt combative on arrival, handcuffed to bed by [employer]. Pt refusing ativan, not forthcoming regarding answers to questions (see HPI).  Will need admit to ICU for close monitoring. T/C returned from PCCM APP Anders Simmonds, case discussed, including:  HPI, pertinent PM/SHx, VS/PE, dx testing, ED course and treatment:  Agrees with ICU admit, requests to admit to [facility] service and PCCM can consult prn.  0915:  T/C returned from [facility] Dr. Jerolyn Center, case discussed, including:  HPI, pertinent PM/SHx, VS/PE, dx testing, ED course and treatment:  Agreeable to admit.      Final Clinical Impressions(s) / ED Diagnoses   Final diagnoses:  None    ED Discharge Orders    None       Samuel Jester, DO 06/08/17 1352

## 2017-06-07 NOTE — ED Notes (Signed)
Bed: WA16 Expected date:  Expected time:  Means of arrival:  Comments: Pt still in room 

## 2017-06-08 DIAGNOSIS — F1410 Cocaine abuse, uncomplicated: Secondary | ICD-10-CM

## 2017-06-08 DIAGNOSIS — T50904A Poisoning by unspecified drugs, medicaments and biological substances, undetermined, initial encounter: Secondary | ICD-10-CM

## 2017-06-08 DIAGNOSIS — F14929 Cocaine use, unspecified with intoxication, unspecified: Secondary | ICD-10-CM | POA: Diagnosis present

## 2017-06-08 LAB — HIV ANTIBODY (ROUTINE TESTING W REFLEX): HIV SCREEN 4TH GENERATION: NONREACTIVE

## 2017-06-08 LAB — CBC
HCT: 44.4 % (ref 39.0–52.0)
Hemoglobin: 14.6 g/dL (ref 13.0–17.0)
MCH: 31.4 pg (ref 26.0–34.0)
MCHC: 32.9 g/dL (ref 30.0–36.0)
MCV: 95.5 fL (ref 78.0–100.0)
PLATELETS: 187 10*3/uL (ref 150–400)
RBC: 4.65 MIL/uL (ref 4.22–5.81)
RDW: 13.7 % (ref 11.5–15.5)
WBC: 8 10*3/uL (ref 4.0–10.5)

## 2017-06-08 NOTE — Progress Notes (Signed)
Pt discharged to home with family.  All belongings sent home with pt.  Pt verbalized understanding of discharge instructions and follow up care.  Education provided re: medications, substance abuse care, when to call MD and follow up care.   Sundra AlandMaura S Ledford Goodson, RN

## 2017-06-08 NOTE — Progress Notes (Signed)
RN received call from Poison control re: pt status.  Poison Control agreed with MD discharge after 24 hours of observation.  Pt has not received any narcotics and/or benzodiazepines.  Pt will not be discharged with any medications.  Pt was provided number to poison control for further information or concern. Pt stable upon discharge.  Sundra AlandMaura S Fotini Lemus, RN

## 2017-06-08 NOTE — Discharge Summary (Addendum)
Physician Discharge Summary  Joseph Bernard ZOX:[id number] DOB: 01/17/1986 DOA: 06/07/2017  PCP: Joseph Bernard, Edwin, MD  Admit date: 06/07/2017 Discharge date: 06/08/2017  Admitted From: Home Disposition:  Home  Recommendations for Outpatient Follow-up:  1. Follow up with PCP in 2-3 weeks 2. Patient advised to stop using illicit drugs including cocaine, meth  Discharge Condition:Stable CODE STATUS:Full Diet recommendation: Regular   Brief/Interim Summary: 32 y.o. male with no significant past medical history other than tobacco abuse brought in by EMS for ingestion of a bag of cocaine.  Family also reported that he has not slept in 2 days and have been using meth  Amphetamines.  Patient reported that at baseline he smokes marijuana every day and smokes cigarettes.  What he did today or tonight is he got scared of the police outside his house and he just wanted to get rid of the cocaine so he ingested the whole cocaine with a bag he just ate it.  He said he has never done this before he said he has no suicidal ideations.  He has a 699-year-old child and his girlfriend is pregnant with a second child.  When I asked him about the children patient started tearing up.  He works as a Production designer, theatre/television/filmmanager at  [employer].  He denies any chest pain shortness of breath nausea vomiting diarrhea headaches changes with his vision. Patient was placed under observation overnight.  1] multidrug abuse with cocaine/marijuana -patient reported he ingested a whole bag of cocaine of 1.5g per patient -Family had reported that he has been using methamphetamines for the last 2 days without sleep.   -He does take the prescription Adderall which has amphetamines.   -Patient denied suicidal ideation -Poison control was called with recommendation for 24hr observation with no benzos x 12hrs. Last ativan given at 2300 on evening prior to discharge. Patient has remained stable without EKG changes -Pt no longer agitated, following commands, and  no longer needing involuntary commitment  -Cessation done at bedside  2] tobacco abuse nicotine patch given  3] hypokalemia K is 5.3 at presentation. Resolved    Discharge Diagnoses:  Active Problems:   Cocaine abuse [facility](HCC)    Discharge Instructions   Allergies as of 06/08/2017      Reactions   Hydrocodone Itching, Other (See Comments)   Skin burning      Medication List    STOP taking these medications   dicyclomine 20 MG tablet Commonly known as:  BENTYL   naproxen 500 MG tablet Commonly known as:  NAPROSYN   ondansetron 4 MG disintegrating tablet Commonly known as:  ZOFRAN ODT   oxyCODONE-acetaminophen 5-325 MG tablet Commonly known as:  PERCOCET   penicillin v potassium 500 MG tablet Commonly known as:  VEETID     TAKE these medications   acetaminophen 325 MG tablet Commonly known as:  TYLENOL Take 650 mg by mouth every 6 (six) hours as needed for moderate pain.   ALPRAZolam 1 MG tablet Commonly known as:  XANAX Take 1 mg by mouth 2 (two) times daily as needed for anxiety.   amphetamine-dextroamphetamine 30 MG 24 hr capsule Commonly known as:  ADDERALL XR Take 30 mg by mouth daily.   docusate sodium 250 MG capsule Commonly known as:  COLACE Take 1 capsule (250 mg total) by mouth daily.   QUEtiapine 100 MG tablet Commonly known as:  SEROQUEL Take 100 mg by mouth at bedtime.      Follow-up Information    Joseph Bernard, Edwin, MD. Schedule  an appointment as soon as possible for a visit in 2 week(s).   Specialty:  Internal Medicine Contact information: [address] [place] [address] [phone]          Allergies  Allergen Reactions  . Hydrocodone Itching and Other (See Comments)    Skin burning    Procedures/Studies: Dg Abd Acute W/chest  Result Date: 06/07/2017 CLINICAL DATA:  Patient stated he swallowed a bag of cocaine. Looking for foreign body. Semi-erect done due to patient being uncooperative. No distress. EXAM: DG  ABDOMEN ACUTE W/ 1V CHEST COMPARISON:  CT scan, 02/23/2017 FINDINGS: Normal bowel gas pattern. No evidence of a radiopaque ingested foreign body. No free air. No renal or ureteral stones.  Soft tissues are unremarkable. Normal heart, mediastinum and hila.  Clear lungs. Skeletal structures are unremarkable. IMPRESSION: Negative abdominal radiographs. No evidence of an ingested radiopaque foreign body. No acute cardiopulmonary disease. Electronically Signed   By: Amie Portland M.D.   On: 06/07/2017 08:12     Subjective: Without complaints  Discharge Exam: Vitals:   06/08/17 1200 06/08/17 1600  BP: 106/67   Pulse: 80   Resp:    Temp:  97.7 F (36.5 C)  SpO2: 100%    Vitals:   06/08/17 1000 06/08/17 1158 06/08/17 1200 06/08/17 1600  BP: 129/83  106/67   Pulse: 72  80   Resp:      Temp:  98.3 F (36.8 C)  97.7 F (36.5 C)  TempSrc:  Oral  Oral  SpO2: 100%  100%   Weight:      Height:        General: Pt is alert, awake, not in acute distress Cardiovascular: RRR, S1/S2 +, no rubs, no gallops Respiratory: CTA bilaterally, no wheezing, no rhonchi Abdominal: Soft, NT, ND, bowel sounds + Extremities: no edema, no cyanosis   The results of significant diagnostics from this hospitalization (including imaging, microbiology, ancillary and laboratory) are listed below for reference.     Microbiology: Recent Results (from the past 240 hour(s))  MRSA PCR Screening     Status: None   Collection Time: 06/07/17 11:31 AM  Result Value Ref Range Status   MRSA by PCR NEGATIVE NEGATIVE Final    Comment:        The GeneXpert MRSA Assay (FDA approved for NASAL specimens only), is one component of a comprehensive MRSA colonization surveillance program. It is not intended to diagnose MRSA infection nor to guide or monitor treatment for MRSA infections. Performed at [facility], [address], [place], [place] [address]      Labs: BNP (last 3 results) No results  for input(s): BNP in the last 8760 hours. Basic Metabolic Panel: Recent Labs  Lab 06/07/17 0734 06/07/17 1642  NA 135 137  K 5.3* 3.8  CL 101 104  CO2 21* 24  GLUCOSE 103* 116*  BUN 13 15  CREATININE 1.03 0.86  CALCIUM 9.5 9.5   Liver Function Tests: Recent Labs  Lab 06/07/17 0734 06/07/17 1642  AST 35 22  ALT 28 22  ALKPHOS 59 58  BILITOT 1.3* 1.0  PROT 8.0 7.0  ALBUMIN 4.8 4.3   No results for input(s): LIPASE, AMYLASE in the last 168 hours. No results for input(s): AMMONIA in the last 168 hours. CBC: Recent Labs  Lab 06/07/17 0735 06/07/17 1130 06/07/17 1642 06/08/17 0323  WBC 10.2 11.6* 9.5 8.0  NEUTROABS 7.1  --   --   --   HGB 15.5 15.0  14.8 14.6  HCT 44.5 43.3 43.0 44.4  MCV 91.9 92.5 92.9 95.5  PLT 204 214 185 187   Cardiac Enzymes: No results for input(s): CKTOTAL, CKMB, CKMBINDEX, TROPONINI in the last 168 hours. BNP: Invalid input(s): POCBNP CBG: No results for input(s): GLUCAP in the last 168 hours. D-Dimer No results for input(s): DDIMER in the last 72 hours. Hgb A1c No results for input(s): HGBA1C in the last 72 hours. Lipid Profile No results for input(s): CHOL, HDL, LDLCALC, TRIG, CHOLHDL, LDLDIRECT in the last 72 hours. Thyroid function studies No results for input(s): TSH, T4TOTAL, T3FREE, THYROIDAB in the last 72 hours.  Invalid input(s): FREET3 Anemia work up No results for input(s): VITAMINB12, FOLATE, FERRITIN, TIBC, IRON, RETICCTPCT in the last 72 hours. Urinalysis    Component Value Date/Time   COLORURINE AMBER (A) 11/26/2014 1633   APPEARANCEUR CLEAR 11/26/2014 1633   LABSPEC 1.034 (H) 11/26/2014 1633   PHURINE 6.0 11/26/2014 1633   GLUCOSEU NEGATIVE 11/26/2014 1633   HGBUR NEGATIVE 11/26/2014 1633   BILIRUBINUR SMALL (A) 11/26/2014 1633   KETONESUR 40 (A) 11/26/2014 1633   PROTEINUR NEGATIVE 11/26/2014 1633   UROBILINOGEN 1.0 11/26/2014 1633   NITRITE NEGATIVE 11/26/2014 1633   LEUKOCYTESUR NEGATIVE 11/26/2014 1633    Sepsis Labs Invalid input(s): PROCALCITONIN,  WBC,  LACTICIDVEN Microbiology Recent Results (from the past 240 hour(s))  MRSA PCR Screening     Status: None   Collection Time: 06/07/17 11:31 AM  Result Value Ref Range Status   MRSA by PCR NEGATIVE NEGATIVE Final    Comment:        The GeneXpert MRSA Assay (FDA approved for NASAL specimens only), is one component of a comprehensive MRSA colonization surveillance program. It is not intended to diagnose MRSA infection nor to guide or monitor treatment for MRSA infections. Performed at [facility], [address], [place], [place] [address]      SIGNED:   Rickey Barbara, MD  [facility] 06/08/2017, 4:13 PM  If 7PM-7AM, please contact night-coverage [email or web address] Password [email or web address]

## 2017-06-08 NOTE — Care Management Note (Signed)
Case Management Note  Patient Details  Name: Ninfa LindenMaurice C Byas MRN: [id number] Date of Birth: 10/22/1985  Subjective/Objective:  Per Psych CSW if MD resending IVC, & patient is stable to d/c home-not needed to contact [employer].MD notified.No CM needs.                  Action/Plan:d/c home.   Expected Discharge Date:  (unknown)               Expected Discharge Plan:  Home/Self Care  In-House Referral:  Clinical Social Work  Discharge planning Services  CM Consult  Post Acute Care Choice:    Choice offered to:     DME Arranged:    DME Agency:     HH Arranged:    HH Agency:     Status of Service:  Completed, signed off  If discussed at [employer] Length of [employer] Meetings, dates discussed:    Additional Comments:  Lanier ClamMahabir, Ramiah Helfrich, RN 06/08/2017, 4:04 PM

## 2017-06-08 NOTE — Care Management Note (Signed)
Case Management Note  Patient Details  Name: Joseph LindenMaurice C Shingledecker MRN: [id number] Date of Birth: 12/21/1985  Subjective/Objective: 32 y/o m admitted w/Cocaine abuse. Noted IVC-CSW notified.                   Action/Plan:d/c plan IP Psych.   Expected Discharge Date:  (unknown)               [facility]  In-House Referral:  Clinical Social Work  Discharge planning Services  CM Consult  Post Acute Care Choice:    Choice offered to:     DME Arranged:    DME Agency:     HH Arranged:    HH Agency:     Status of Service:  In process, will continue to follow  If discussed at Long Length of Stay Meetings, dates discussed:    Additional Comments:  Lanier ClamMahabir, Leny Morozov, RN 06/08/2017, 1:58 PM
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
## 2017-09-27 IMAGING — CR DG CHEST 1V PORT
1 series · 1 of 1 positions shown · non-contrast
Comparison: Chest radiograph dated 03/14/2011

CLINICAL DATA: 30-year-old male with level 2 trauma.

EXAM:
PORTABLE CHEST 1 VIEW

[AP]
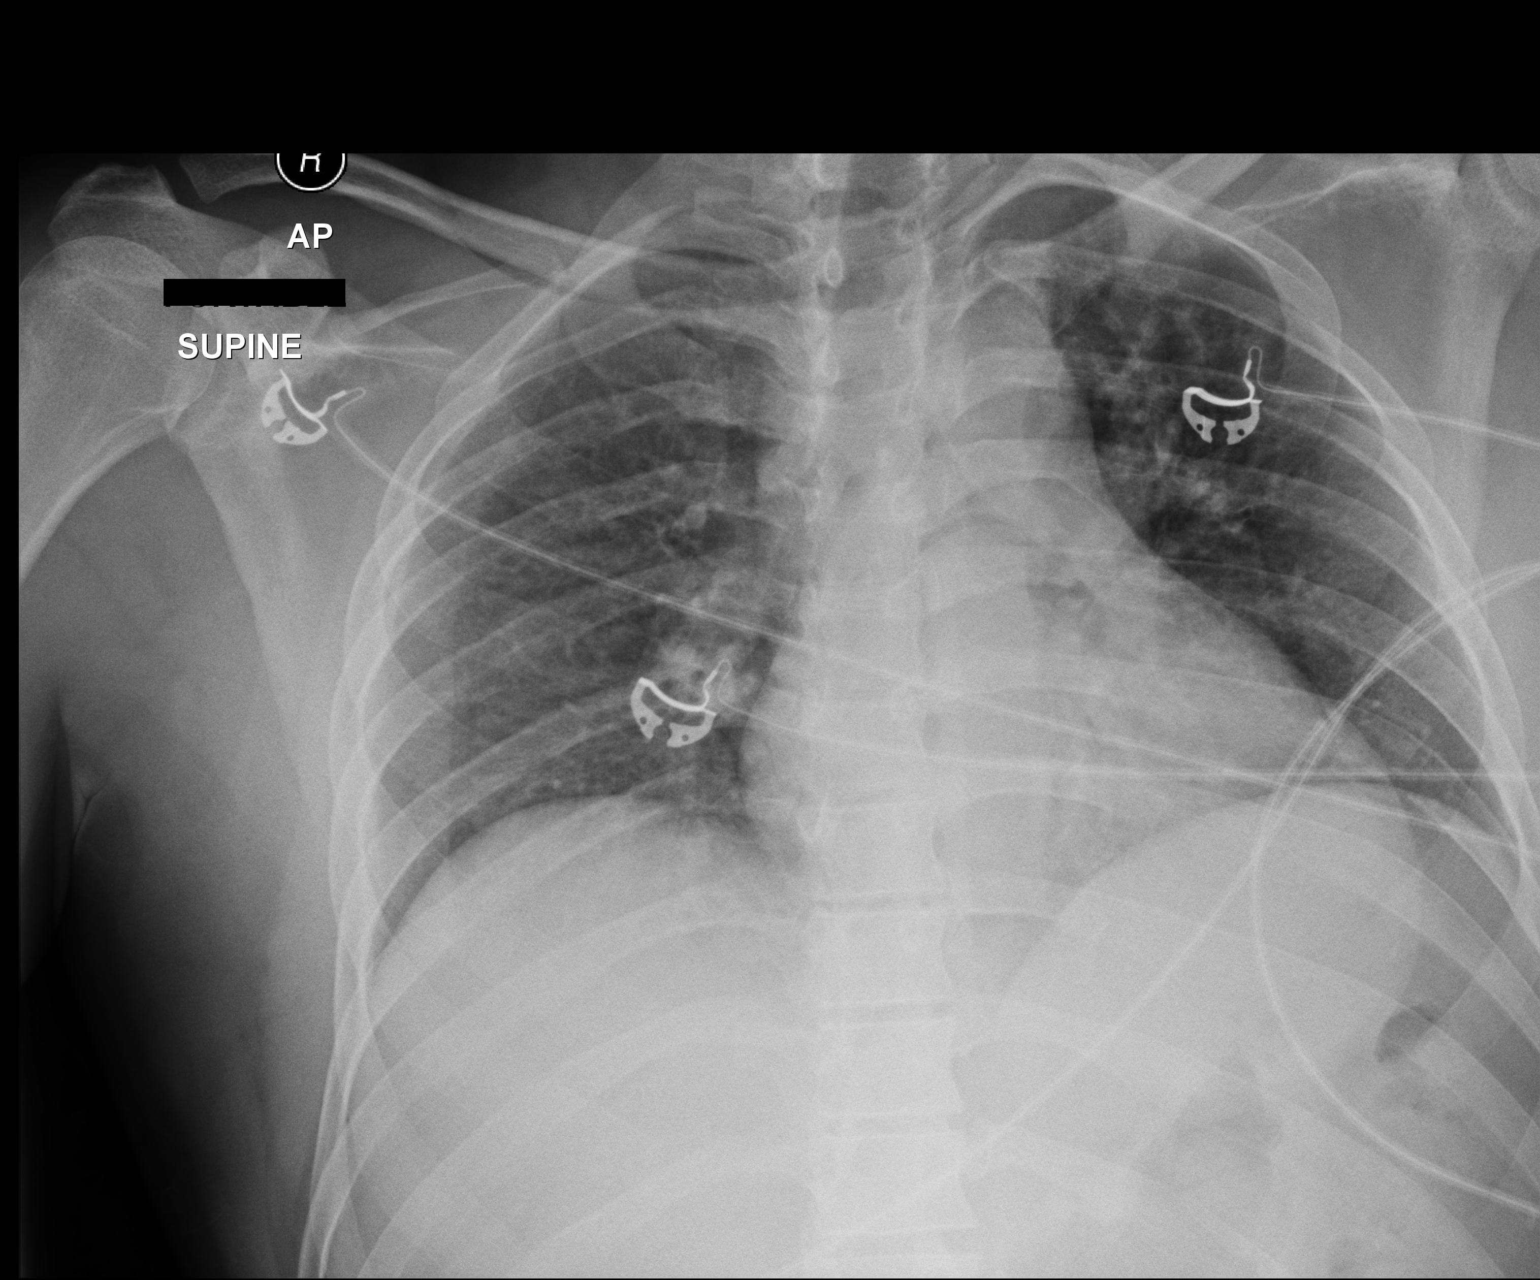

[1 of 1 positions shown; findings below may reference images not displayed]

FINDINGS: Mild diffuse interstitial prominence, likely related to atelectatic
changes and crowding. Superimposed pneumonia is not excluded. There
is no focal consolidation, pleural effusion, or pneumothorax. Mild
cardiomegaly. No definite acute fracture.
IMPRESSION: Mild interstitial prominence and coarsening may represent
atelectatic changes versus less likely atypical pneumonia. No
definite traumatic pathology.

Mild cardiomegaly.

## 2018-01-21 ENCOUNTER — Emergency Department (HOSPITAL_COMMUNITY)
Admission: EM | Admit: 2018-01-21 | Discharge: 2018-01-22 | Disposition: A | Discharge status: Home / Self Care | Payer: Self-pay | Attending: Emergency Medicine | Admitting: Emergency Medicine

## 2018-01-21 DIAGNOSIS — F17210 Nicotine dependence, cigarettes, uncomplicated: Secondary | ICD-10-CM | POA: Insufficient documentation

## 2018-01-21 DIAGNOSIS — F1910 Other psychoactive substance abuse, uncomplicated: Secondary | ICD-10-CM | POA: Insufficient documentation

## 2018-01-21 DIAGNOSIS — Z79899 Other long term (current) drug therapy: Secondary | ICD-10-CM | POA: Insufficient documentation

## 2018-01-21 LAB — COMPREHENSIVE METABOLIC PANEL
ALT: 23 U/L (ref 0–44)
AST: 38 U/L (ref 15–41)
Albumin: 4.9 g/dL (ref 3.5–5.0)
Alkaline Phosphatase: 66 U/L (ref 38–126)
Anion gap: 15 (ref 5–15)
BUN: 15 mg/dL (ref 6–20)
CHLORIDE: 102 mmol/L (ref 98–111)
CO2: 18 mmol/L — ABNORMAL LOW (ref 22–32)
Calcium: 9.5 mg/dL (ref 8.9–10.3)
Creatinine, Ser: 1.01 mg/dL (ref 0.61–1.24)
Glucose, Bld: 93 mg/dL (ref 70–99)
POTASSIUM: 3.7 mmol/L (ref 3.5–5.1)
Sodium: 135 mmol/L (ref 135–145)
Total Bilirubin: 1.5 mg/dL — ABNORMAL HIGH (ref 0.3–1.2)
Total Protein: 7.7 g/dL (ref 6.5–8.1)

## 2018-01-21 LAB — CBC WITH DIFFERENTIAL/PLATELET
Abs Immature Granulocytes: 0.08 10*3/uL — ABNORMAL HIGH (ref 0.00–0.07)
BASOS PCT: 1 %
Basophils Absolute: 0.1 10*3/uL (ref 0.0–0.1)
EOS ABS: 0.1 10*3/uL (ref 0.0–0.5)
EOS PCT: 1 %
HCT: 43.4 % (ref 39.0–52.0)
Hemoglobin: 14.8 g/dL (ref 13.0–17.0)
Immature Granulocytes: 1 %
Lymphocytes Relative: 17 %
Lymphs Abs: 2.2 10*3/uL (ref 0.7–4.0)
MCH: 32 pg (ref 26.0–34.0)
MCHC: 34.1 g/dL (ref 30.0–36.0)
MCV: 93.9 fL (ref 80.0–100.0)
MONO ABS: 1.5 10*3/uL — AB (ref 0.1–1.0)
MONOS PCT: 12 %
Neutro Abs: 8.9 10*3/uL — ABNORMAL HIGH (ref 1.7–7.7)
Neutrophils Relative %: 68 %
PLATELETS: 249 10*3/uL (ref 150–400)
RBC: 4.62 MIL/uL (ref 4.22–5.81)
RDW: 12.9 % (ref 11.5–15.5)
WBC: 12.8 10*3/uL — ABNORMAL HIGH (ref 4.0–10.5)
nRBC: 0 % (ref 0.0–0.2)

## 2018-01-21 LAB — ETHANOL

## 2018-01-21 LAB — CK: CK TOTAL: 535 U/L — AB (ref 49–397)

## 2018-01-21 MED ORDER — SODIUM CHLORIDE 0.9 % IV BOLUS
2000.0000 mL | Freq: Once | INTRAVENOUS | Status: AC
  Administered 2018-01-21: 2000 mL via INTRAVENOUS

## 2018-01-21 MED ORDER — LORAZEPAM 1 MG PO TABS
2.0000 mg | ORAL_TABLET | Freq: Once | ORAL | Status: AC
  Administered 2018-01-21: 2 mg via ORAL
  Filled 2018-01-21: qty 2

## 2018-01-21 MED ORDER — LORAZEPAM 2 MG/ML IJ SOLN
1.0000 mg | Freq: Once | INTRAMUSCULAR | Status: AC
  Administered 2018-01-21: 1 mg via INTRAVENOUS
  Filled 2018-01-21: qty 1

## 2018-01-21 MED ORDER — SODIUM CHLORIDE 0.9 % IV SOLN: INTRAVENOUS | Status: DC

## 2018-01-21 NOTE — ED Notes (Signed)
Bed: [facility] Expected date:  Expected time:  Means of arrival:  Comments: 

## 2018-01-21 NOTE — ED Provider Notes (Signed)
6:51 AM Assumed care from Dr. Freida BusmanAllen, please see their note for full history, physical and decision making until this point. In brief this is a 32 y.o. year old male who presented to the ED tonight with Anxiety and Addiction Problem     Sympathomimetic syndrome. Had fluids and ativan. reeval for disposition.   Improving HR, BP and vitals. No complaints. Tolerating PO. Stable for discharge.   Discharge instructions, including strict return precautions for new or worsening symptoms, given. Patient and/or family verbalized understanding and agreement with the plan as described.   Labs, studies and imaging reviewed by myself and considered in medical decision making if ordered. Imaging interpreted by radiology.  Labs Reviewed  CBC WITH DIFFERENTIAL/PLATELET - Abnormal; Notable for the following components:      Result Value   WBC 12.8 (*)    Neutro Abs 8.9 (*)    Monocytes Absolute 1.5 (*)    Abs Immature Granulocytes 0.08 (*)    All other components within normal limits  COMPREHENSIVE METABOLIC PANEL - Abnormal; Notable for the following components:   CO2 18 (*)    Total Bilirubin 1.5 (*)    All other components within normal limits  RAPID URINE DRUG SCREEN, HOSP PERFORMED - Abnormal; Notable for the following components:   Benzodiazepines POSITIVE (*)    Amphetamines POSITIVE (*)    Tetrahydrocannabinol POSITIVE (*)    All other components within normal limits  CK - Abnormal; Notable for the following components:   Total CK 535 (*)    All other components within normal limits  ETHANOL    No orders to display    No follow-ups on file.    Marily MemosMesner, Carmichael Burdette, MD 01/22/18 [phone]

## 2018-01-21 NOTE — ED Notes (Signed)
Writer asked pt to provide urine sample but pt stated "I don't wanna pee right now". Writer will attempt to obtain urine sample when pt is cooperative.

## 2018-01-21 NOTE — ED Triage Notes (Signed)
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

## 2018-01-21 NOTE — ED Notes (Signed)
Pt very anxious and refuses to lay down in bed.

## 2018-01-21 NOTE — ED Notes (Signed)
Security wanded pt and a razor blade was taken and locked up per protocol by security.

## 2018-01-21 NOTE — ED Provider Notes (Signed)
[facility]-EMERGENCY DEPT Provider Note   CSN: [id number] Arrival date & time: 01/21/18  2046     History   Chief Complaint Chief Complaint  Patient presents with  . Anxiety  . Addiction Problem    HPI Joseph Bernard is a 32 y.o. male.  32 year old male presents with increased anxiety x1 day.  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  Patient states he is unsure as to why he is here today.  He denies any suicidal or homicidal ideations.  He is very evasive when being questioned.  No further history obtainable due to his current state     Past Medical History:  Diagnosis Date  . No pertinent past medical history     Patient Active Problem List   Diagnosis Date Noted  . Cocaine intoxication (HCC) 06/08/2017  . Cocaine abuse (HCC) 06/07/2017    Past Surgical History:  Procedure Laterality Date  . dental extractions    . TENDON REPAIR  03/17/2012   Procedure: TENDON REPAIR;  Surgeon: Dominica Severin, MD;  Location: [facility] OR;  Service: Orthopedics;  Laterality: Right;  Irrigation & Debridement and Repair Nerve and Tendon Right Wrist and Hand        Home Medications    Prior to Admission medications   Medication Sig Start Date End Date Taking? Authorizing Provider  acetaminophen (TYLENOL) 325 MG tablet Take 650 mg by mouth every 6 (six) hours as needed for moderate pain.    [provider]  ALPRAZolam Prudy Feeler) 1 MG tablet Take 1 mg by mouth 2 (two) times daily as needed for anxiety.    [provider]  amphetamine-dextroamphetamine (ADDERALL XR) 30 MG 24 hr capsule Take 30 mg by mouth daily.    [provider]  docusate sodium (COLACE) 250 MG capsule Take 1 capsule (250 mg total) by mouth daily. 02/24/17   Dartha Lodge, PA-C  QUEtiapine (SEROQUEL) 100 MG tablet Take 100 mg by mouth at bedtime.    [provider]    Family History Family History  Problem  Relation Age of Onset  . Diabetes Other     Social History Social History   Tobacco Use  . Smoking status: Current Every Day Smoker    Packs/day: 0.50  . Smokeless tobacco: Never Used  Substance Use Topics  . Alcohol use: No    Frequency: Never  . Drug use: Yes    Types: Methamphetamines, Cocaine     Allergies   Hydrocodone   Review of Systems Review of Systems  Unable to perform ROS: Psychiatric disorder     Physical Exam Updated Vital Signs BP (!) 141/99   Pulse (!) 136   Temp 98.4 F (36.9 C) (Oral)   Resp (!) 24   SpO2 98%   Physical Exam  Constitutional: He is oriented to person, place, and time. He appears well-developed and well-nourished.  Non-toxic appearance. No distress.  HENT:  Head: Normocephalic and atraumatic.  Eyes: Pupils are equal, round, and reactive to light. Conjunctivae, EOM and lids are normal.  Neck: Normal range of motion. Neck supple. No tracheal deviation present. No thyroid mass present.  Cardiovascular: Regular rhythm and normal heart sounds. Tachycardia present. Exam reveals no gallop.  No murmur heard. Pulmonary/Chest: Effort normal and breath sounds normal. No stridor. No respiratory distress. He has no decreased breath sounds. He has no wheezes. He has no rhonchi. He has no rales.  Abdominal: Soft. Normal  appearance and bowel sounds are normal. He exhibits no distension. There is no tenderness. There is no rebound and no CVA tenderness.  Musculoskeletal: Normal range of motion. He exhibits no edema or tenderness.  Neurological: He is alert and oriented to person, place, and time. He has normal strength. No cranial nerve deficit or sensory deficit. GCS eye subscore is 4. GCS verbal subscore is 5. GCS motor subscore is 6.  Skin: Skin is warm and dry. No abrasion and no rash noted.  Psychiatric: His mood appears anxious. His speech is rapid and/or pressured. He is agitated, aggressive and hyperactive.  Nursing note and vitals  reviewed.    ED Treatments / Results  Labs (all labs ordered are listed, but only abnormal results are displayed) Labs Reviewed  CBC WITH DIFFERENTIAL/PLATELET  COMPREHENSIVE METABOLIC PANEL  ETHANOL  RAPID URINE DRUG SCREEN, HOSP PERFORMED    EKG EKG Interpretation  Date/Time:  Friday January 21 2018 21:52:33 EST Ventricular Rate:  110 PR Interval:    QRS Duration: 88 QT Interval:  320 QTC Calculation: 433 R Axis:   62 Text Interpretation:  Sinus tachycardia Left atrial enlargement Confirmed by Lorre NickAllen, Yavonne Kiss ([id number]) on 01/21/2018 10:50:01 PM   Radiology No results found.  Procedures Procedures (including critical care time)  Medications Ordered in ED Medications  LORazepam (ATIVAN) tablet 2 mg (has no administration in time range)     Initial Impression / Assessment and Plan / ED Course  I have reviewed the triage vital signs and the nursing notes.  Pertinent labs & imaging results that were available during my care of the patient were reviewed by me and considered in my medical decision making (see chart for details).     Pt given oral ativan and HR improved  Remains tremulous, will re dose ativan and given iv fluids  Suspect cocaine or methamphetamine toxicity   uds pending  Care signed out to Dr. Clayborne DanaMesner  Final Clinical Impressions(s) / ED Diagnoses   Final diagnoses:  None    ED Discharge Orders    None       Lorre NickAllen, Pranish Akhavan, MD 01/21/18 2303

## 2018-01-21 NOTE — ED Notes (Signed)
Pt found with razor blade in his pocket, security has razor blade in their possession.

## 2018-01-21 NOTE — ED Notes (Signed)
[facility] Expected date:  Expected time:  Means of arrival:  Comments: EMS smoked marijuana and now anxious

## 2018-01-22 LAB — RAPID URINE DRUG SCREEN, HOSP PERFORMED
Amphetamines: POSITIVE — AB
BENZODIAZEPINES: POSITIVE — AB
Barbiturates: NOT DETECTED
Cocaine: NOT DETECTED
Opiates: NOT DETECTED
Tetrahydrocannabinol: POSITIVE — AB

## 2019-07-17 ENCOUNTER — Ambulatory Visit: Payer: Self-pay | Attending: Internal Medicine

## 2019-07-17 DIAGNOSIS — Z23 Encounter for immunization: Secondary | ICD-10-CM

## 2019-07-17 NOTE — Progress Notes (Signed)
   Covid-19 Vaccination Clinic  Name:  Joseph Bernard    MRN: [id number] DOB: Nov 18, 1985  07/17/2019  Joseph Bernard was observed post Covid-19 immunization for 15 minutes without incident. He was provided with Vaccine Information Sheet and instruction to access the V-Safe system.   Joseph Bernard was instructed to call 911 with any severe reactions post vaccine: Marland Kitchen Difficulty breathing  . Swelling of face and throat  . A fast heartbeat  . A bad rash all over body  . Dizziness and weakness   Immunizations Administered    Name Date Dose VIS Date Route   Pfizer COVID-19 Vaccine 07/17/2019  3:14 PM 0.3 mL 05/03/2018 Intramuscular   Manufacturer: [employer], [employer]   Lot: [id number]   NDC: [address]-7

## 2019-08-08 ENCOUNTER — Ambulatory Visit: Payer: Self-pay | Attending: Internal Medicine

## 2019-08-08 DIAGNOSIS — Z23 Encounter for immunization: Secondary | ICD-10-CM

## 2019-08-08 NOTE — Progress Notes (Signed)
   Covid-19 Vaccination Clinic  Name:  Joseph Bernard    MRN: [id number] DOB: 05/27/1985  08/08/2019  [name] was observed post Covid-19 immunization for 15 minutes without incident. He was provided with Vaccine Information Sheet and instruction to access the V-Safe system.   [name] was instructed to call 911 with any severe reactions post vaccine: Marland Kitchen Difficulty breathing  . Swelling of face and throat  . A fast heartbeat  . A bad rash all over body  . Dizziness and weakness   Immunizations Administered    Name Date Dose VIS Date Route   Pfizer COVID-19 Vaccine 08/08/2019  1:30 PM 0.3 mL 05/03/2018 Intramuscular   Manufacturer: [employer], [employer]   Lot: [id number]   NDC: [address]-7

## 2020-06-26 ENCOUNTER — Encounter (HOSPITAL_COMMUNITY): Payer: Self-pay | Admitting: Emergency Medicine

## 2020-06-26 ENCOUNTER — Emergency Department (HOSPITAL_COMMUNITY)
Admission: EM | Admit: 2020-06-26 | Discharge: 2020-06-26 | Disposition: A | Discharge status: Home / Self Care | Payer: Self-pay | Attending: Emergency Medicine | Admitting: Emergency Medicine

## 2020-06-26 ENCOUNTER — Emergency Department (HOSPITAL_COMMUNITY): Payer: Self-pay

## 2020-06-26 ENCOUNTER — Other Ambulatory Visit: Payer: Self-pay

## 2020-06-26 DIAGNOSIS — F17200 Nicotine dependence, unspecified, uncomplicated: Secondary | ICD-10-CM | POA: Insufficient documentation

## 2020-06-26 DIAGNOSIS — K047 Periapical abscess without sinus: Secondary | ICD-10-CM | POA: Insufficient documentation

## 2020-06-26 DIAGNOSIS — L03211 Cellulitis of face: Secondary | ICD-10-CM | POA: Insufficient documentation

## 2020-06-26 LAB — BASIC METABOLIC PANEL
Anion gap: 12 (ref 5–15)
BUN: 11 mg/dL (ref 6–20)
CO2: 20 mmol/L — ABNORMAL LOW (ref 22–32)
Calcium: 9.5 mg/dL (ref 8.9–10.3)
Chloride: 110 mmol/L (ref 98–111)
Creatinine, Ser: 0.7 mg/dL (ref 0.61–1.24)
GFR, Estimated: >60 mL/min (ref >60)
Glucose, Bld: 88 mg/dL (ref 70–99)
Potassium: 3.5 mmol/L (ref 3.5–5.1)
Sodium: 142 mmol/L (ref 135–145)

## 2020-06-26 LAB — CBC WITH DIFFERENTIAL/PLATELET
Abs Immature Granulocytes: 0.09 10*3/uL — ABNORMAL HIGH (ref 0.00–0.07)
Basophils Absolute: 0.1 10*3/uL (ref 0.0–0.1)
Basophils Relative: 1 %
Eosinophils Absolute: 0.2 10*3/uL (ref 0.0–0.5)
Eosinophils Relative: 2 %
HCT: 44.5 % (ref 39.0–52.0)
Hemoglobin: 14.8 g/dL (ref 13.0–17.0)
Immature Granulocytes: 1 %
Lymphocytes Relative: 22 %
Lymphs Abs: 2.5 10*3/uL (ref 0.7–4.0)
MCH: 31.6 pg (ref 26.0–34.0)
MCHC: 33.3 g/dL (ref 30.0–36.0)
MCV: 95.1 fL (ref 80.0–100.0)
Monocytes Absolute: 1 10*3/uL (ref 0.1–1.0)
Monocytes Relative: 9 %
Neutro Abs: 7.4 10*3/uL (ref 1.7–7.7)
Neutrophils Relative %: 65 %
Platelets: 249 10*3/uL (ref 150–400)
RBC: 4.68 MIL/uL (ref 4.22–5.81)
RDW: 13.3 % (ref 11.5–15.5)
WBC: 11.2 10*3/uL — ABNORMAL HIGH (ref 4.0–10.5)
nRBC: 0 % (ref 0.0–0.2)

## 2020-06-26 MED ORDER — OXYCODONE-ACETAMINOPHEN 5-325 MG PO TABS: 1.0000 | ORAL_TABLET | Freq: Four times a day (QID) | ORAL | 0 refills | Status: AC | PRN

## 2020-06-26 MED ORDER — HYDROMORPHONE HCL 1 MG/ML IJ SOLN
1.0000 mg | Freq: Once | INTRAMUSCULAR | Status: AC
  Administered 2020-06-26: 1 mg via INTRAVENOUS
  Filled 2020-06-26: qty 1

## 2020-06-26 MED ORDER — ACETAMINOPHEN 325 MG PO TABS
650.0000 mg | ORAL_TABLET | Freq: Once | ORAL | Status: AC
  Administered 2020-06-26: 650 mg via ORAL
  Filled 2020-06-26: qty 2

## 2020-06-26 MED ORDER — IOHEXOL 300 MG/ML  SOLN
75.0000 mL | Freq: Once | INTRAMUSCULAR | Status: AC | PRN
  Administered 2020-06-26: 75 mL via INTRAVENOUS

## 2020-06-26 MED ORDER — CLINDAMYCIN HCL 300 MG PO CAPS: 300.0000 mg | ORAL_CAPSULE | Freq: Three times a day (TID) | ORAL | 0 refills | Status: AC

## 2020-06-26 MED ORDER — BENZOCAINE 20 % MT AERO
INHALATION_SPRAY | Freq: Once | OROMUCOSAL | Status: AC
  Filled 2020-06-26: qty 57

## 2020-06-26 MED ORDER — IBUPROFEN 800 MG PO TABS: 800.0000 mg | ORAL_TABLET | Freq: Three times a day (TID) | ORAL | 0 refills | Status: AC

## 2020-06-26 MED ORDER — IBUPROFEN 200 MG PO TABS
600.0000 mg | ORAL_TABLET | Freq: Once | ORAL | Status: AC
  Administered 2020-06-26: 600 mg via ORAL
  Filled 2020-06-26: qty 3

## 2020-06-26 MED ORDER — SODIUM CHLORIDE 0.9 % IV SOLN
3.0000 g | Freq: Once | INTRAVENOUS | Status: AC
  Administered 2020-06-26: 3 g via INTRAVENOUS
  Filled 2020-06-26: qty 8

## 2020-06-26 MED ORDER — LIDOCAINE HCL (PF) 1 % IJ SOLN
5.0000 mL | Freq: Once | INTRAMUSCULAR | Status: AC
  Administered 2020-06-26: 5 mL
  Filled 2020-06-26: qty 30

## 2020-06-26 MED ORDER — CLINDAMYCIN HCL 300 MG PO CAPS: 300.0000 mg | ORAL_CAPSULE | Freq: Three times a day (TID) | ORAL | 0 refills | Status: DC

## 2020-06-26 MED ORDER — IBUPROFEN 800 MG PO TABS: 800.0000 mg | ORAL_TABLET | Freq: Three times a day (TID) | ORAL | 0 refills | Status: DC

## 2020-06-26 MED ORDER — OXYCODONE-ACETAMINOPHEN 5-325 MG PO TABS: 1.0000 | ORAL_TABLET | Freq: Four times a day (QID) | ORAL | 0 refills | Status: DC | PRN

## 2020-06-26 NOTE — ED Notes (Signed)
Pt requesting to speak with MD. Dr. Donnald Garre made aware.

## 2020-06-26 NOTE — ED Notes (Signed)
Dr. Pfeiffer at bedside   

## 2020-06-26 NOTE — ED Provider Notes (Signed)
Patient evaluated by Dr. Audley Hose.  CT shows apical abscess with cortical defect leading to facial swelling.  Patient reports pain has improved since treatment with antibiotics, ibuprofen and Tylenol. Physical Exam  BP (!) 161/113   Pulse 88   Temp 98.2 F (36.8 C) (Oral)   Resp 16   Ht 6' (1.829 m)   Wt 80 kg   SpO2 98%   BMI 23.92 kg/m   Physical Exam Constitutional:      Comments: Patient is alert and nontoxic.  Clear mental status.  HENT:     Head:     Comments: Moderate to large swelling over the right side of the face over the zygoma and cheek.  No swelling at the angle of the mandible or the mandible.  Floor of the mouth is soft.  Neck is supple and soft without any pain or lymphadenopathy.    Mouth/Throat:     Comments: Patient has a decayed tooth in the right upper maxilla.  Above this there is about a 1 cm fluctuant abscess. Eyes:     Comments: Normal extraocular motions.  No ocular pain with extraocular motions.  No periorbital swelling.  Pulmonary:     Effort: Pulmonary effort is normal.  Musculoskeletal:        General: Normal range of motion.     Cervical back: Neck supple.  Skin:    General: Skin is warm and dry.  Neurological:     General: No focal deficit present.     Mental Status: He is oriented to person, place, and time.     Coordination: Coordination normal.  Psychiatric:        Mood and Affect: Mood normal.     ED Course/Procedures     .Marland KitchenIncision and Drainage  Date/Time: 06/26/2020 7:13 PM Performed by: Arby Barrette, MD Authorized by: Arby Barrette, MD   Consent:    Consent obtained:  Verbal   Consent given by:  Patient   Risks discussed:  Bleeding, incomplete drainage, pain, infection and damage to other organs Location:    Type:  Abscess   Location:  Mouth   Mouth location:  Alveolar process Anesthesia:    Anesthesia method:  Topical application and local infiltration   Local anesthetic:  Lidocaine 1% w/o epi Procedure type:     Complexity:  Simple Procedure details:    Needle aspiration: no     Incision types:  Stab incision   Incision depth:  Dermal   Wound management:  Probed and deloculated   Drainage:  Purulent and bloody   Drainage amount:  Moderate   Wound treatment:  Wound left open Post-procedure details:    Procedure completion:  Tolerated well, no immediate complications Comments:     HurriCaine spray used over the abscess area.  1% lidocaine used locally for incision site.  Stab incision made.  Moderate to copious purulent drainage.  Lightly deloculated with soft plastic catheter and aspirated additional amount of pus.  Patient tolerated well    MDM  Patient had periapical abscess identified on CT and visible on physical exam.  IV antibiotics administered by Dr. Audley Hose.  He was given 1 IV dose of Unasyn.  At this time abscess is draining.  Will start the patient on clindamycin, ibuprofen and Percocet for additional pain control.  Patient is counseled on dental follow-up plan.  Return precautions reviewed.       Arby Barrette, MD 06/26/20 [phone]

## 2020-06-26 NOTE — ED Notes (Signed)
Patient transported to CT 

## 2020-06-26 NOTE — ED Provider Notes (Signed)
MSE was initiated and I personally evaluated the patient and placed orders (if any) at  1:44 PM on June 26, 2020.  The patient appears stable so that the remainder of the MSE may be completed by another provider.  Patient presents with right-sided facial pain ongoing for about a week.  Increased swelling and tenderness noted to the right face for the past few days.  Denies fevers at home.  No vomiting or diarrhea.  On exam patient appears in mild distress. HEENT: Right maxillofacial swelling and tenderness noted.  Questionable fluctuant lesion palpated in the right upper maxillary region. Oral: No gumline abscess noted.  Tenderness palpation on the tooth of the right upper and right lower molar/premolar region. Abdomen: Soft nontender no guarding or rebound. Extremities: No swelling or edema. Neuro: Awake alert moving all extremities no focal neurodeficit noted.    Cheryll Cockayne, MD 06/26/20 1345

## 2020-06-26 NOTE — ED Provider Notes (Signed)
[facility]-EMERGENCY DEPT Provider Note   CSN: [id number] Arrival date & time: 06/26/20  1314     History Chief Complaint  Patient presents with  . Dental Pain    Joseph Bernard is a 35 y.o. male.  Patient presenting with right-sided facial swelling for 2 to 3 days.  He is noticed dental pain in the right upper and lower jaw for the past week or so.  However he is noted swelling all in the few days.  Denies any fevers or cough or vomiting or diarrhea, he has noted increased pain on the right side of the face.  Denies any difficulty breathing or swallowing his saliva.         Past Medical History:  Diagnosis Date  . No pertinent past medical history     Patient Active Problem List   Diagnosis Date Noted  . Cocaine intoxication (HCC) 06/08/2017  . Cocaine abuse (HCC) 06/07/2017    Past Surgical History:  Procedure Laterality Date  . dental extractions    . TENDON REPAIR  03/17/2012   Procedure: TENDON REPAIR;  Surgeon: Dominica Severin, MD;  Location: [facility] OR;  Service: Orthopedics;  Laterality: Right;  Irrigation & Debridement and Repair Nerve and Tendon Right Wrist and Hand       Family History  Problem Relation Age of Onset  . Diabetes Other     Social History   Tobacco Use  . Smoking status: Current Every Day Smoker    Packs/day: 0.50  . Smokeless tobacco: Never Used  Vaping Use  . Vaping Use: Never used  Substance Use Topics  . Alcohol use: No  . Drug use: Yes    Types: Methamphetamines, Cocaine    Home Medications Prior to Admission medications   Medication Sig Start Date End Date Taking? Authorizing Provider  acetaminophen (TYLENOL) 325 MG tablet Take 650 mg by mouth every 6 (six) hours as needed for moderate pain.    [provider]  ALPRAZolam Prudy Feeler) 1 MG tablet Take 1 mg by mouth 2 (two) times daily as needed for anxiety.    [provider]  amphetamine-dextroamphetamine (ADDERALL XR) 30 MG 24 hr capsule  Take 30 mg by mouth daily.    [provider]  docusate sodium (COLACE) 250 MG capsule Take 1 capsule (250 mg total) by mouth daily. 02/24/17   Dartha Lodge, PA-C  QUEtiapine (SEROQUEL) 100 MG tablet Take 100 mg by mouth at bedtime.    [provider]    Allergies    Hydrocodone  Review of Systems   Review of Systems  Constitutional: Negative for fever.  HENT: Negative for ear pain and sore throat.   Eyes: Negative for pain.  Respiratory: Negative for cough.   Cardiovascular: Negative for chest pain.  Gastrointestinal: Negative for abdominal pain.  Genitourinary: Negative for flank pain.  Musculoskeletal: Negative for back pain.  Skin: Negative for color change and rash.  Neurological: Negative for syncope.  All other systems reviewed and are negative.   Physical Exam Updated Vital Signs BP (!) 150/104 (BP Location: Left Arm)   Pulse 95   Temp 98.2 F (36.8 C) (Oral)   Resp 18   Ht 6' (1.829 m)   Wt 80 kg   SpO2 100%   BMI 23.92 kg/m   Physical Exam Constitutional:      General: He is not in acute distress.    Appearance: He is well-developed.  HENT:     Head: Normocephalic.  Nose: Nose normal.     Mouth/Throat:     Comments: No gumline abscess or swelling noted.  Tenderness palpation in the right buccal mucosa region.  Right-sided facial swelling in her right maxillary region noted. Eyes:     Extraocular Movements: Extraocular movements intact.  Cardiovascular:     Rate and Rhythm: Normal rate.  Pulmonary:     Effort: Pulmonary effort is normal.  Skin:    Coloration: Skin is not jaundiced.  Neurological:     General: No focal deficit present.     Mental Status: He is alert. Mental status is at baseline.     Cranial Nerves: No cranial nerve deficit.     Motor: No weakness.     Gait: Gait normal.     ED Results / Procedures / Treatments   Labs (all labs ordered are listed, but only abnormal results are displayed) Labs Reviewed   CBC WITH DIFFERENTIAL/PLATELET - Abnormal; Notable for the following components:      Result Value   WBC 11.2 (*)    Abs Immature Granulocytes 0.09 (*)    All other components within normal limits  BASIC METABOLIC PANEL    EKG None  Radiology No results found.  Procedures Procedures   Medications Ordered in ED Medications  acetaminophen (TYLENOL) tablet 650 mg (has no administration in time range)  ibuprofen (ADVIL) tablet 600 mg (has no administration in time range)    ED Course  I have reviewed the triage vital signs and the nursing notes.  Pertinent labs & imaging results that were available during my care of the patient were reviewed by me and considered in my medical decision making (see chart for details).    MDM Rules/Calculators/A&P                          Labs centered on relatively unremarkable.  Pending CT scan of his to rule out facial abscess.  Will be signed out to oncoming physician provider.   Final Clinical Impression(s) / ED Diagnoses Final diagnoses:  Facial cellulitis    Rx / DC Orders ED Discharge Orders    None       Cheryll Cockayne, MD 06/26/20 1443

## 2020-06-26 NOTE — ED Notes (Signed)
Patient back from CT at this time

## 2020-06-26 NOTE — ED Triage Notes (Signed)
Patient complains of R sided facial swelling from an upper R jaw molar, soreness x1 week, has been taking aleve for the pain with some relief.

## 2020-06-26 NOTE — Discharge Instructions (Signed)
1.  Go and fill your prescriptions today.  Start taking your antibiotics as prescribed 2.  Take ibuprofen every 8 hours with some food for pain control.  You may also take 1-2 Percocet every 6 hours if you need additional pain control. 3.  Swish your mouth with half hydrogen peroxide half water mix 3 times a day for the next 2 days. 4.  Get a follow-up appointment with the dentist as soon as possible. 5.  Return to emergency department if you are developing fever, increasing or worsening swelling of your face, pain not controlled by medications, any problems with pain in your eye or headache or other concerning symptoms.
